# Patient Record
Sex: Female | Born: 1935 | Race: White | Hispanic: No | Marital: Married | Smoking: Never smoker
Health system: Southern US, Community
[De-identification: ages and names within clinical notes are randomized; demographics above are authoritative.]

## PROBLEM LIST (undated history)

## (undated) DIAGNOSIS — I10 Essential (primary) hypertension: Secondary | ICD-10-CM

## (undated) DIAGNOSIS — M109 Gout, unspecified: Secondary | ICD-10-CM

## (undated) DIAGNOSIS — N390 Urinary tract infection, site not specified: Secondary | ICD-10-CM

## (undated) DIAGNOSIS — H9190 Unspecified hearing loss, unspecified ear: Secondary | ICD-10-CM

## (undated) DIAGNOSIS — E785 Hyperlipidemia, unspecified: Secondary | ICD-10-CM

## (undated) HISTORY — PX: KNEE ARTHROSCOPY: SHX127

---

## 2005-09-22 ENCOUNTER — Emergency Department: Payer: Self-pay | Admitting: General Practice

## 2005-10-09 ENCOUNTER — Ambulatory Visit: Payer: Self-pay | Admitting: Internal Medicine

## 2005-10-14 ENCOUNTER — Ambulatory Visit: Payer: Self-pay | Admitting: Pain Medicine

## 2005-11-04 ENCOUNTER — Ambulatory Visit: Payer: Self-pay | Admitting: Pain Medicine

## 2005-11-18 ENCOUNTER — Ambulatory Visit: Payer: Self-pay | Admitting: Pain Medicine

## 2005-12-03 ENCOUNTER — Ambulatory Visit: Payer: Self-pay | Admitting: Physician Assistant

## 2006-07-20 ENCOUNTER — Ambulatory Visit: Payer: Self-pay | Admitting: Unknown Physician Specialty

## 2006-07-29 ENCOUNTER — Other Ambulatory Visit: Payer: Self-pay

## 2006-07-29 ENCOUNTER — Ambulatory Visit: Payer: Self-pay | Admitting: Unknown Physician Specialty

## 2012-09-13 ENCOUNTER — Ambulatory Visit: Payer: Self-pay | Admitting: Family Medicine

## 2013-02-23 ENCOUNTER — Ambulatory Visit: Payer: Self-pay | Admitting: Ophthalmology

## 2013-02-23 DIAGNOSIS — I10 Essential (primary) hypertension: Secondary | ICD-10-CM

## 2013-03-09 ENCOUNTER — Ambulatory Visit: Payer: Self-pay | Admitting: Ophthalmology

## 2013-09-01 ENCOUNTER — Ambulatory Visit: Payer: Self-pay | Admitting: Ophthalmology

## 2013-09-12 ENCOUNTER — Ambulatory Visit: Payer: Self-pay | Admitting: Ophthalmology

## 2013-09-14 ENCOUNTER — Ambulatory Visit: Payer: Self-pay | Admitting: Family Medicine

## 2014-09-18 ENCOUNTER — Ambulatory Visit: Payer: Self-pay | Admitting: Family Medicine

## 2015-01-12 NOTE — Op Note (Signed)
PATIENT NAME:  Martha CollumFOSTER, Bellina MR#:  [id number] DATE OF BIRTH:  09-Jan-1924  DATE OF PROCEDURE:  03/09/2013  PREOPERATIVE DIAGNOSIS: Visually significant cataract of the left eye.   POSTOPERATIVE DIAGNOSIS: Visually significant cataract of the left eye.   OPERATIVE PROCEDURE: Cataract extraction by phacoemulsification with implant of intraocular lens to the left eye.   SURGEON: Galen ManilaWilliam Maxamus Colao, MD.   ANESTHESIA:  1. Managed anesthesia care.  2. Topical tetracaine drops followed by 2% Xylocaine jelly applied in the preoperative holding area.   COMPLICATIONS: None.   TECHNIQUE:  Stop-and-chop.   DESCRIPTION OF PROCEDURE: The patient was examined and consented in the preoperative holding area where the aforementioned topical anesthesia was applied to the left eye and then brought back to the operating room where the left eye was prepped and draped in the usual sterile ophthalmic fashion and a lid speculum was placed. A paracentesis was created with the side port blade and the anterior chamber was filled with viscoelastic. A near clear corneal incision was performed with the steel keratome. A continuous curvilinear capsulorrhexis was performed with a cystotome followed by the capsulorrhexis forceps. Hydrodissection and hydrodelineation were carried out with BSS on a blunt cannula. The lens was removed in a stop-and-chop technique and the remaining cortical material was removed with the irrigation-aspiration handpiece. The capsular bag was inflated with viscoelastic and the Tecnis ZCBOO 18.0-diopter lens, serial #[id number] was placed in the capsular bag without complication. The remaining viscoelastic was removed from the eye with the irrigation-aspiration handpiece. The wounds were hydrated. The anterior chamber was flushed with Miostat and the eye was inflated to physiologic pressure; 0.1 mL of cefuroxime at a concentration of 10 mg/mL was placed in the anterior chamber. The wounds were found to be  water tight. The eye was dressed with Vigamox. The patient was given protective glasses to wear throughout the day and a shield with which to sleep tonight. The patient was also given drops with which to begin a drop regimen today and will follow-up with me in 1 day.     ____________________________ Jerilee FieldWilliam L. Darriel Utter, MD wlp:dm D: 03/09/2013 12:24:00 ET T: 03/09/2013 15:29:52 ET JOB#: [id number]  cc: Brianni Manthe L. Shadow Stiggers, MD, <Dictator> Jerilee FieldWILLIAM L Donnis Pecha MD ELECTRONICALLY SIGNED 03/10/2013 14:40

## 2015-01-13 NOTE — Op Note (Signed)
PATIENT NAME:  Martha CollumFOSTER, Emmerie MR#:  [id number] DATE OF BIRTH:  [birth year]  DATE OF PROCEDURE:  09/12/2013  PREOPERATIVE DIAGNOSIS: Visually significant cataract of the right eye.   POSTOPERATIVE DIAGNOSIS: Visually significant cataract of the right eye.   OPERATIVE PROCEDURE: Cataract extraction by phacoemulsification with implant of intraocular lens to the right eye.   SURGEON: Galen ManilaWilliam Scorpio Fortin, MD.   ANESTHESIA:  1. Managed anesthesia care.  2. Topical tetracaine drops followed by 2% Xylocaine jelly applied in the preoperative holding area.   COMPLICATIONS: None.   TECHNIQUE:  Stop and chop.    DESCRIPTION OF PROCEDURE: The patient was examined and consented in the preoperative holding area where the aforementioned topical anesthesia was applied to the right eye and then brought back to the Operating Room where the right eye was prepped and draped in the usual sterile ophthalmic fashion and a lid speculum was placed. A paracentesis was created with the side port blade and the anterior chamber was filled with viscoelastic. A near clear corneal incision was performed with the steel keratome. A continuous curvilinear capsulorrhexis was performed with a cystotome followed by the capsulorrhexis forceps. Hydrodissection and hydrodelineation were carried out with BSS on a blunt cannula. The lens was removed in a stop and chop technique and the remaining cortical material was removed with the irrigation-aspiration handpiece. The capsular bag was inflated with viscoelastic and the Tecnis ZCB00 19.0-diopter lens, serial number [id number] was placed in the capsular bag without complication. The remaining viscoelastic was removed from the eye with the irrigation-aspiration handpiece. The wounds were hydrated. The anterior chamber was flushed with Miostat and the eye was inflated to physiologic pressure. 0.1 mL of cefuroxime concentration 10 mg/mL was placed in the anterior chamber. The wounds were found to be  water tight. The eye was dressed with Vigamox. The patient was given protective glasses to wear throughout the day and a shield with which to sleep tonight. The patient was also given drops with which to begin a drop regimen today and will follow-up with me in one day.   ____________________________ Jerilee FieldWilliam L. Shanta Dorvil, MD wlp:gb D: 09/12/2013 17:00:16 ET T: 09/12/2013 23:05:25 ET JOB#: [id number]  cc: Perina Salvaggio L. Zariana Strub, MD, <Dictator> Jerilee FieldWILLIAM L Tyrina Hines MD ELECTRONICALLY SIGNED 09/29/2013 17:18

## 2016-07-29 DIAGNOSIS — R001 Bradycardia, unspecified: Secondary | ICD-10-CM | POA: Insufficient documentation

## 2020-02-29 ENCOUNTER — Encounter: Payer: Self-pay | Admitting: Urology

## 2020-02-29 ENCOUNTER — Ambulatory Visit: Payer: Medicare Other | Admitting: Urology

## 2020-02-29 ENCOUNTER — Other Ambulatory Visit: Payer: Self-pay

## 2020-02-29 ENCOUNTER — Encounter (INDEPENDENT_AMBULATORY_CARE_PROVIDER_SITE_OTHER): Payer: Self-pay

## 2020-02-29 VITALS — BP 153/73 | HR 61 | Ht 61.0 in | Wt 141.0 lb

## 2020-02-29 DIAGNOSIS — R351 Nocturia: Secondary | ICD-10-CM | POA: Diagnosis not present

## 2020-02-29 DIAGNOSIS — R3129 Other microscopic hematuria: Secondary | ICD-10-CM | POA: Diagnosis not present

## 2020-02-29 LAB — BLADDER SCAN AMB NON-IMAGING

## 2020-02-29 MED ORDER — CEPHALEXIN 500 MG PO CAPS: 500.0000 mg | ORAL_CAPSULE | Freq: Every day | ORAL | 11 refills | Status: DC

## 2020-02-29 NOTE — Patient Instructions (Signed)
1. Stop drinking fluids 3-4 hours before going to bed 2. Urinate right before going to bed  Follow up in one month for symptom check

## 2020-02-29 NOTE — Progress Notes (Signed)
   02/29/20 9:33 AM   Lidia Collum 1924-05-14 [id number]  CC: Nocturia  HPI: I saw Ms. Bobier in urology clinic today for nocturia.  She is a 86 year old frail-appearing female who reports a multiple year history of nocturia every hour overnight.  She has no urinary complaints during the day.  She does have some occasional leakage overnight as well.  She also has a history of recurrent urinary tract infections, please see care everywhere for full details.  She denies any gross hematuria.  She previously was followed by Dr. Orson Slick in urology and reportedly underwent a urethral dilation at some point, these notes are unavailable to me though.  Myrbetriq is listed on her medication list, but she denies that she takes this medication.  She goes to bed at 6 PM and wakes up at 9 AM.  She drinks only water during the day, but continues to drink fluids up to right before bed.  She takes melatonin before bed to help with sleep.  Urinalysis today 11-30 WBCs, 3-10 RBCs, no bacteria, nitrite negative.  PVR normal at 11 mL.   Social History:  reports that she has never smoked. She has never used smokeless tobacco. She reports that she does not drink alcohol or use drugs.  Physical Exam: BP (!) 153/73   Pulse 61   Ht 5\' 1"  (1.549 m)   Wt 141 lb (64 kg)   BMI 26.64 kg/m    Constitutional: Elderly, frail appearing, in wheelchair Cardiovascular: No clubbing, cyanosis, or edema. Respiratory: Normal respiratory effort, no increased work of breathing. No lower extremity edema  Laboratory Data: Reviewed, see HPI  Pertinent Imaging: None to review  Assessment & Plan:   In summary, she is a 86 year old very frail and elderly female with multiple year history of nocturia every hour overnight.  I had a very long conversation with the patient and her son about realistic expectations regarding her urinary symptoms and nocturia.  I recommend starting with behavioral strategies including minimizing fluids 3 to  4 hours before bedtime, and double voiding prior to bed.  I also recommended a renal ultrasound to rule out any significant pathology in the setting of her recurrent UTIs and microscopic hematuria today.  I also recommended a trial of Myrbetriq 25 mg daily, she has not been taking this medication reportedly despite being on her med list.  Behavioral strategies discussed at length Trial of Myrbetriq 25 mg daily RTC 1 month for symptom check and discuss renal ultrasound results   97, MD 02/29/2020  [facility] [address] [place], [place] [phone]

## 2020-03-01 LAB — URINALYSIS, COMPLETE
Bilirubin, UA: NEGATIVE
Glucose, UA: NEGATIVE
Ketones, UA: NEGATIVE
Nitrite, UA: NEGATIVE
Protein,UA: NEGATIVE
Specific Gravity, UA: 1.01 (ref 1.005–1.030)
Urobilinogen, Ur: 0.2 mg/dL (ref 0.2–1.0)
pH, UA: 5 (ref 5.0–7.5)

## 2020-03-01 LAB — MICROSCOPIC EXAMINATION: Bacteria, UA: NONE SEEN

## 2020-03-02 LAB — CULTURE, URINE COMPREHENSIVE

## 2020-03-05 ENCOUNTER — Telehealth: Payer: Self-pay

## 2020-03-05 MED ORDER — AMOXICILLIN 500 MG PO CAPS: 500.0000 mg | ORAL_CAPSULE | Freq: Two times a day (BID) | ORAL | 0 refills | Status: DC

## 2020-03-05 NOTE — Telephone Encounter (Signed)
Patient notified and script sent to pharmacy. Patient states she is taking suppression abx 1 daily, she was instructed to stop while taking the Amoxicillin and restart after she completes that course. Patient verbalized understanding

## 2020-03-05 NOTE — Telephone Encounter (Signed)
-----   Message from Sondra Come, MD sent at 03/05/2020 11:46 AM EDT ----- She had some bacteria in the urine, possible uti, Lets do amoxicillin 500mg  bID x 3 days, thanks  , MD 03/05/2020

## 2020-04-02 ENCOUNTER — Ambulatory Visit
Admission: RE | Admit: 2020-04-02 | Discharge: 2020-04-02 | Disposition: A | Discharge status: Home / Self Care | Payer: Medicare Other | Source: Ambulatory Visit | Attending: Urology | Admitting: Urology

## 2020-04-02 ENCOUNTER — Other Ambulatory Visit: Payer: Self-pay

## 2020-04-02 DIAGNOSIS — R3129 Other microscopic hematuria: Secondary | ICD-10-CM | POA: Insufficient documentation

## 2020-04-02 DIAGNOSIS — R351 Nocturia: Secondary | ICD-10-CM

## 2020-04-04 ENCOUNTER — Ambulatory Visit (INDEPENDENT_AMBULATORY_CARE_PROVIDER_SITE_OTHER): Payer: Medicare Other | Admitting: Urology

## 2020-04-04 ENCOUNTER — Other Ambulatory Visit: Payer: Self-pay

## 2020-04-04 ENCOUNTER — Encounter: Payer: Self-pay | Admitting: Urology

## 2020-04-04 VITALS — BP 140/77 | HR 65 | Ht 65.0 in | Wt 140.0 lb

## 2020-04-04 DIAGNOSIS — N3281 Overactive bladder: Secondary | ICD-10-CM | POA: Diagnosis not present

## 2020-04-04 DIAGNOSIS — R351 Nocturia: Secondary | ICD-10-CM

## 2020-04-04 DIAGNOSIS — N63 Unspecified lump in unspecified breast: Secondary | ICD-10-CM | POA: Insufficient documentation

## 2020-04-04 DIAGNOSIS — M109 Gout, unspecified: Secondary | ICD-10-CM | POA: Insufficient documentation

## 2020-04-04 DIAGNOSIS — B029 Zoster without complications: Secondary | ICD-10-CM | POA: Insufficient documentation

## 2020-04-04 DIAGNOSIS — N390 Urinary tract infection, site not specified: Secondary | ICD-10-CM | POA: Diagnosis not present

## 2020-04-04 DIAGNOSIS — I10 Essential (primary) hypertension: Secondary | ICD-10-CM | POA: Insufficient documentation

## 2020-04-04 MED ORDER — MIRABEGRON ER 50 MG PO TB24: 50.0000 mg | ORAL_TABLET | Freq: Every day | ORAL | 11 refills | Status: DC

## 2020-04-04 MED ORDER — MIRABEGRON ER 25 MG PO TB24: 50.0000 mg | ORAL_TABLET | Freq: Every day | ORAL | 11 refills | Status: DC

## 2020-04-04 NOTE — Progress Notes (Signed)
   04/04/2020 9:26 AM   Martha Vazquez [birth year] [id number]  Reason for visit: Follow up OAB, nocturia, rUTI  HPI: I saw Martha Vazquez and her son back in urology clinic today to discuss the above issues.  Briefly, she is a 86 year old frail-appearing female who has a long history of nocturia every hour overnight, and minimal urinary complaints during the day.  She was getting up every hour when I met her originally.  PVR was normal at 11 mL.  We tried Myrbetriq 25 mg daily, and we discussed behavioral strategies at length previously including minimizing fluids before bed, and voiding prior to bed.  We also performed a renal ultrasound with her history of recurrent UTIs and microscopic hematuria, which was benign.  She continues to have bothersome nocturia, but is now getting up every hour and a half instead of every hour.  Urine culture at her last visit ultimately grew 25-50k Enterococcus, and she was treated with culture appropriate amoxicillin.  She does not think this improved her urinary symptoms significantly.  We had a long conversation about the difference between asymptomatic bacteriuria versus true UTI.  With her lack of symptoms during the day, no dysuria, and only overnight symptoms, I do not think this represented a true UTI, and I think her symptoms are related to age and the fact that she is sleeping at least 15 hours a night from 6 PM to 9 AM.  I again was very frank with the patient and her son regarding realistic expectations in the setting of her age and frailty.  Anticholinergics would be strictly contraindicated with her age and frailty and risk of confusion or falls, and I would also avoid desmopressin with her risk for hyponatremia or even seizures.  Increase Myrbetriq to 50 mg daily, take in the afternoon instead of morning Continue behavioral strategies and minimizing fluids within a few hours of bedtime RTC 6 months for follow-up   [place], [place] [address], [place], [place] [address] [phone]

## 2020-04-04 NOTE — Patient Instructions (Signed)
Take Mybetriq before bed.

## 2020-10-04 ENCOUNTER — Ambulatory Visit: Payer: Self-pay | Admitting: Urology

## 2020-12-27 ENCOUNTER — Ambulatory Visit (INDEPENDENT_AMBULATORY_CARE_PROVIDER_SITE_OTHER): Payer: Medicare Other | Admitting: Urology

## 2020-12-27 ENCOUNTER — Other Ambulatory Visit: Payer: Self-pay

## 2020-12-27 ENCOUNTER — Encounter: Payer: Self-pay | Admitting: Urology

## 2020-12-27 VITALS — BP 92/54 | HR 53 | Ht 65.0 in | Wt 157.0 lb

## 2020-12-27 DIAGNOSIS — N3281 Overactive bladder: Secondary | ICD-10-CM | POA: Diagnosis not present

## 2020-12-27 DIAGNOSIS — N390 Urinary tract infection, site not specified: Secondary | ICD-10-CM

## 2020-12-27 NOTE — Progress Notes (Signed)
   12/27/2020 10:49 AM   Martha Vazquez [birth year] [id number]  Reason for visit: History of UTIs, OAB/nocturia  HPI: I saw [name] and her family today for possible UTI.  She is a very frail 87 year old female who had a recent fall and was having some back pain and was recently diagnosed by PCP with a UTI yesterday.  She denies any dysuria or pelvic pain.  Urinalysis yesterday was concerning for infection with nitrite positive, large leukocytes, greater than 50 WBCs, 0 RBCs, few bacteria, rare squamous cells.  This was sent for culture, and she was started on Cipro 250 mg twice daily x5 days.  Lab work also notable for hyperkalemia of 6.5, and creatinine of 1.7 from a creatinine of 1.2 at baseline.  Sounds like she could be dehydrated as well.  She appears to be at her baseline cognition today.  Previously, she was on Keflex daily prophylaxis, and denies any UTIs over the last 9 months.  She previously trialed Myrbetriq for her overactive symptoms and nocturia without any significant improvement.  Part of her nocturia is that she goes to bed at 6 PM and wakes up at 9 AM, and so understandably she does get up to void during that time.  Agree with likely UTI, we again discussed the differences between asymptomatic bacteriuria and true UTI, but with her back pain and recent fall, I think is very reasonable to treat this with antibiotics.  It sounds like her PCP repeated labs today to confirm improvement in her hyperkalemia.  Agree with Cipro, follow-up cultures, call with results Consider ongoing prophylactic antibiotics pending culture findings    Sondra Come, MD  [facility] [address], [address] [place], [place] [address] [phone]

## 2021-03-13 ENCOUNTER — Other Ambulatory Visit: Payer: Self-pay | Admitting: Nephrology

## 2021-03-13 DIAGNOSIS — N179 Acute kidney failure, unspecified: Secondary | ICD-10-CM

## 2021-03-13 DIAGNOSIS — N1832 Chronic kidney disease, stage 3b: Secondary | ICD-10-CM

## 2021-07-03 ENCOUNTER — Ambulatory Visit: Payer: Self-pay | Admitting: Urology

## 2021-11-28 ENCOUNTER — Inpatient Hospital Stay
Admission: EM | Admit: 2021-11-28 | Discharge: 2021-11-29 | DRG: 871 | Disposition: A | Discharge status: Hospice | Payer: Medicare Other | Attending: Internal Medicine | Admitting: Internal Medicine

## 2021-11-28 ENCOUNTER — Other Ambulatory Visit: Payer: Self-pay

## 2021-11-28 ENCOUNTER — Emergency Department: Payer: Medicare Other

## 2021-11-28 DIAGNOSIS — R296 Repeated falls: Secondary | ICD-10-CM | POA: Diagnosis present

## 2021-11-28 DIAGNOSIS — Z66 Do not resuscitate: Secondary | ICD-10-CM | POA: Diagnosis present

## 2021-11-28 DIAGNOSIS — E875 Hyperkalemia: Secondary | ICD-10-CM | POA: Diagnosis present

## 2021-11-28 DIAGNOSIS — Z79899 Other long term (current) drug therapy: Secondary | ICD-10-CM | POA: Diagnosis not present

## 2021-11-28 DIAGNOSIS — I10 Essential (primary) hypertension: Secondary | ICD-10-CM | POA: Diagnosis not present

## 2021-11-28 DIAGNOSIS — N390 Urinary tract infection, site not specified: Secondary | ICD-10-CM | POA: Insufficient documentation

## 2021-11-28 DIAGNOSIS — G9341 Metabolic encephalopathy: Secondary | ICD-10-CM | POA: Diagnosis present

## 2021-11-28 DIAGNOSIS — N3001 Acute cystitis with hematuria: Secondary | ICD-10-CM | POA: Diagnosis not present

## 2021-11-28 DIAGNOSIS — N1831 Chronic kidney disease, stage 3a: Secondary | ICD-10-CM | POA: Diagnosis present

## 2021-11-28 DIAGNOSIS — Z20822 Contact with and (suspected) exposure to covid-19: Secondary | ICD-10-CM | POA: Diagnosis present

## 2021-11-28 DIAGNOSIS — R001 Bradycardia, unspecified: Secondary | ICD-10-CM

## 2021-11-28 DIAGNOSIS — W19XXXA Unspecified fall, initial encounter: Principal | ICD-10-CM

## 2021-11-28 DIAGNOSIS — R680 Hypothermia, not associated with low environmental temperature: Secondary | ICD-10-CM | POA: Diagnosis present

## 2021-11-28 DIAGNOSIS — M19012 Primary osteoarthritis, left shoulder: Secondary | ICD-10-CM | POA: Diagnosis present

## 2021-11-28 DIAGNOSIS — S40012A Contusion of left shoulder, initial encounter: Secondary | ICD-10-CM

## 2021-11-28 DIAGNOSIS — A419 Sepsis, unspecified organism: Secondary | ICD-10-CM | POA: Diagnosis present

## 2021-11-28 DIAGNOSIS — T68XXXA Hypothermia, initial encounter: Secondary | ICD-10-CM

## 2021-11-28 DIAGNOSIS — R6520 Severe sepsis without septic shock: Secondary | ICD-10-CM | POA: Diagnosis present

## 2021-11-28 DIAGNOSIS — I129 Hypertensive chronic kidney disease with stage 1 through stage 4 chronic kidney disease, or unspecified chronic kidney disease: Secondary | ICD-10-CM | POA: Diagnosis present

## 2021-11-28 DIAGNOSIS — H9190 Unspecified hearing loss, unspecified ear: Secondary | ICD-10-CM | POA: Diagnosis present

## 2021-11-28 DIAGNOSIS — N3000 Acute cystitis without hematuria: Secondary | ICD-10-CM | POA: Diagnosis not present

## 2021-11-28 DIAGNOSIS — Z7189 Other specified counseling: Secondary | ICD-10-CM

## 2021-11-28 DIAGNOSIS — Z87440 Personal history of urinary (tract) infections: Secondary | ICD-10-CM

## 2021-11-28 DIAGNOSIS — M109 Gout, unspecified: Secondary | ICD-10-CM | POA: Diagnosis present

## 2021-11-28 DIAGNOSIS — E785 Hyperlipidemia, unspecified: Secondary | ICD-10-CM | POA: Diagnosis present

## 2021-11-28 HISTORY — DX: Unspecified hearing loss, unspecified ear: H91.90

## 2021-11-28 HISTORY — DX: Urinary tract infection, site not specified: N39.0

## 2021-11-28 HISTORY — DX: Essential (primary) hypertension: I10

## 2021-11-28 HISTORY — DX: Hyperlipidemia, unspecified: E78.5

## 2021-11-28 HISTORY — DX: Gout, unspecified: M10.9

## 2021-11-28 LAB — PROTIME-INR
INR: 0.9 (ref 0.8–1.2)
Prothrombin Time: 12.6 seconds (ref 11.4–15.2)

## 2021-11-28 LAB — URINALYSIS, ROUTINE W REFLEX MICROSCOPIC
Bilirubin Urine: NEGATIVE
Glucose, UA: NEGATIVE mg/dL
Hgb urine dipstick: NEGATIVE
Ketones, ur: NEGATIVE mg/dL
Nitrite: NEGATIVE
Protein, ur: NEGATIVE mg/dL
Specific Gravity, Urine: 1.011 (ref 1.005–1.030)
pH: 7 (ref 5.0–8.0)

## 2021-11-28 LAB — CBC WITH DIFFERENTIAL/PLATELET
Abs Immature Granulocytes: 0.06 10*3/uL (ref 0.00–0.07)
Basophils Absolute: 0.1 10*3/uL (ref 0.0–0.1)
Basophils Relative: 1 %
Eosinophils Absolute: 0.2 10*3/uL (ref 0.0–0.5)
Eosinophils Relative: 2 %
HCT: 45 % (ref 36.0–46.0)
Hemoglobin: 14.4 g/dL (ref 12.0–15.0)
Immature Granulocytes: 1 %
Lymphocytes Relative: 23 %
Lymphs Abs: 2.4 10*3/uL (ref 0.7–4.0)
MCH: 29.2 pg (ref 26.0–34.0)
MCHC: 32 g/dL (ref 30.0–36.0)
MCV: 91.3 fL (ref 80.0–100.0)
Monocytes Absolute: 0.7 10*3/uL (ref 0.1–1.0)
Monocytes Relative: 7 %
Neutro Abs: 7.1 10*3/uL (ref 1.7–7.7)
Neutrophils Relative %: 66 %
Platelets: 276 10*3/uL (ref 150–400)
RBC: 4.93 MIL/uL (ref 3.87–5.11)
RDW: 15.5 % (ref 11.5–15.5)
WBC: 10.6 10*3/uL — ABNORMAL HIGH (ref 4.0–10.5)
nRBC: 0 % (ref 0.0–0.2)

## 2021-11-28 LAB — RESP PANEL BY RT-PCR (FLU A&B, COVID) ARPGX2
Influenza A by PCR: NEGATIVE
Influenza B by PCR: NEGATIVE
SARS Coronavirus 2 by RT PCR: NEGATIVE

## 2021-11-28 LAB — CK: Total CK: 80 U/L (ref 38–234)

## 2021-11-28 LAB — COMPREHENSIVE METABOLIC PANEL
ALT: 25 U/L (ref 0–44)
AST: 28 U/L (ref 15–41)
Albumin: 3.9 g/dL (ref 3.5–5.0)
Alkaline Phosphatase: 99 U/L (ref 38–126)
Anion gap: 8 (ref 5–15)
BUN: 31 mg/dL — ABNORMAL HIGH (ref 8–23)
CO2: 21 mmol/L — ABNORMAL LOW (ref 22–32)
Calcium: 9.9 mg/dL (ref 8.9–10.3)
Chloride: 110 mmol/L (ref 98–111)
Creatinine, Ser: 1.05 mg/dL — ABNORMAL HIGH (ref 0.44–1.00)
GFR, Estimated: 49 mL/min — ABNORMAL LOW (ref >60)
Glucose, Bld: 92 mg/dL (ref 70–99)
Potassium: 5.8 mmol/L — ABNORMAL HIGH (ref 3.5–5.1)
Sodium: 139 mmol/L (ref 135–145)
Total Bilirubin: 0.5 mg/dL (ref 0.3–1.2)
Total Protein: 7.4 g/dL (ref 6.5–8.1)

## 2021-11-28 LAB — TSH: TSH: 4.681 u[IU]/mL — ABNORMAL HIGH (ref 0.350–4.500)

## 2021-11-28 LAB — TROPONIN I (HIGH SENSITIVITY): Troponin I (High Sensitivity): 11 ng/L (ref <18)

## 2021-11-28 LAB — LACTIC ACID, PLASMA: Lactic Acid, Venous: 0.7 mmol/L (ref 0.5–1.9)

## 2021-11-28 MED ORDER — ALLOPURINOL 100 MG PO TABS
100.0000 mg | ORAL_TABLET | Freq: Every day | ORAL | Status: DC
Start: 2021-11-28 — End: 2021-11-30
  Administered 2021-11-29: 100 mg via ORAL
  Filled 2021-11-28 (×3): qty 1

## 2021-11-28 MED ORDER — ACETAMINOPHEN 325 MG PO TABS
650.0000 mg | ORAL_TABLET | Freq: Four times a day (QID) | ORAL | Status: DC | PRN
  Administered 2021-11-29: 650 mg via ORAL
  Filled 2021-11-28: qty 2

## 2021-11-28 MED ORDER — SODIUM CHLORIDE 0.9 % IV SOLN
1.0000 g | INTRAVENOUS | Status: DC
  Administered 2021-11-29: 1 g via INTRAVENOUS
  Filled 2021-11-28 (×2): qty 10

## 2021-11-28 MED ORDER — SODIUM CHLORIDE 0.9 % IV SOLN
INTRAVENOUS | Status: AC
Start: 2021-11-28 — End: 2021-11-29

## 2021-11-28 MED ORDER — LACTATED RINGERS IV BOLUS
1000.0000 mL | Freq: Once | INTRAVENOUS | Status: AC
Start: 2021-11-28 — End: 2021-11-28
  Administered 2021-11-28: 11:00:00 1000 mL via INTRAVENOUS

## 2021-11-28 MED ORDER — HYDRALAZINE HCL 10 MG PO TABS
10.0000 mg | ORAL_TABLET | Freq: Three times a day (TID) | ORAL | Status: DC
  Administered 2021-11-29: 10 mg via ORAL
  Filled 2021-11-28 (×7): qty 1

## 2021-11-28 MED ORDER — SODIUM CHLORIDE 0.9 % IV SOLN
1.0000 g | Freq: Once | INTRAVENOUS | Status: AC
  Administered 2021-11-28: 14:00:00 1 g via INTRAVENOUS

## 2021-11-28 MED ORDER — SODIUM ZIRCONIUM CYCLOSILICATE 10 G PO PACK
10.0000 g | PACK | Freq: Once | ORAL | Status: DC
  Filled 2021-11-28: qty 1

## 2021-11-28 MED ORDER — HEPARIN SODIUM (PORCINE) 5000 UNIT/ML IJ SOLN
5000.0000 [IU] | Freq: Two times a day (BID) | INTRAMUSCULAR | Status: DC
  Administered 2021-11-29: 5000 [IU] via SUBCUTANEOUS
  Filled 2021-11-28: qty 1

## 2021-11-28 MED ORDER — PRAVASTATIN SODIUM 20 MG PO TABS
40.0000 mg | ORAL_TABLET | Freq: Every day | ORAL | Status: DC
  Administered 2021-11-29: 40 mg via ORAL
  Filled 2021-11-28: qty 2

## 2021-11-28 MED ORDER — ONDANSETRON HCL 4 MG PO TABS: 4.0000 mg | ORAL_TABLET | Freq: Four times a day (QID) | ORAL | Status: DC | PRN

## 2021-11-28 MED ORDER — ACETAMINOPHEN 650 MG RE SUPP: 650.0000 mg | Freq: Four times a day (QID) | RECTAL | Status: DC | PRN

## 2021-11-28 MED ORDER — ONDANSETRON HCL 4 MG/2ML IJ SOLN: 4.0000 mg | Freq: Four times a day (QID) | INTRAMUSCULAR | Status: DC | PRN

## 2021-11-28 MED ORDER — LACTATED RINGERS IV BOLUS
1000.0000 mL | Freq: Once | INTRAVENOUS | Status: AC
  Administered 2021-11-28: 14:00:00 1000 mL via INTRAVENOUS

## 2021-11-28 MED ORDER — AMLODIPINE BESYLATE 10 MG PO TABS
10.0000 mg | ORAL_TABLET | Freq: Every day | ORAL | Status: DC
Start: 2021-11-28 — End: 2021-11-30
  Filled 2021-11-28: qty 2

## 2021-11-28 MED ORDER — BISACODYL 5 MG PO TBEC: 5.0000 mg | DELAYED_RELEASE_TABLET | Freq: Every day | ORAL | Status: DC | PRN

## 2021-11-28 NOTE — ED Notes (Signed)
Pt and son requesting to speak to MD. Pt requesting to be made comfort care.  ?

## 2021-11-28 NOTE — ED Notes (Signed)
Attempted to call report. Waiting on ICU charge to arrive back on floor and accept the pt ? ?

## 2021-11-28 NOTE — ED Triage Notes (Signed)
Pt is here with her son, pt lives alone, states she lost her balance and fell yesterday around 5pm and is c/o left shoulder pain, did hit her head, is not on blood thinner, pt is alert on arrival, is very HOH..uses a walker to ambulate ?

## 2021-11-28 NOTE — ED Notes (Signed)
Patient alert, extremely hard of hearing and unable to communicate with staff as family members took her hearing aides home. Able to state name and ask appropriate questions but unable to understand RN. Resp even, unlabored on RA. No distress noted at this time. ?

## 2021-11-28 NOTE — H&P (Signed)
History and Physical    Martha Vazquez [id number] DOB: 04-27-1924 DOA: 11/28/2021  PCP: Maryland Pink, MD (Confirm with patient/family/NH records and if not entered, this has to be entered at [facility] point of entry) Patient coming from: Home  I have personally briefly reviewed patient's old medical records in [place]  Chief Complaint: I fell  HPI: Martha Vazquez is a 88 y.o. female with medical history significant of recurrent UTIs, sinus bradycardia, HTN, HLD, gout, multiple OA, frequent falls, came with worsening of left shoulder pain and frequent falls.  Patient lives by herself with home care at daytime, son and daughter stop by often.  For the last 2 to 3 days, patient has had worsening of left shoulder pain which is chronic and unsteady gait and frequent falls.  Home care personnel also reported patient has had frequent urination since yesterday.  Patient used to be on suppressive antibiotics for recurrent UTIs which was discontinued 6 months ago, since then patient has had [place]y thought of UTI which was treated with p.o. antibiotics.  Currently, patient complaining about feeling cold and left shoulder pain.  Denies any dysuria no abdominal pain.  This morning, family went to see the patient and found patient was confused and suspect patient has recurrent UTI, and sent her to ED.  At baseline patient has had ambulation dysfunction and frequent falls, 2 years ago patient was diagnosed with bradycardia and was referred to have pacemaker evaluation, however at that point patient has made up her mind that she wanted to be DNR and does not want any aggressive measures including pacemaker.  ED Course: Bradycardia heart rate varies from 37-43, SBP 150s.  Temperature 92.4, O2 saturation 97% on room air.  Compatible with UTI, WBC 10.6, K5.8, creatinine 1.0.  Lactic acid 0.7.  Patient was started on ceftriaxone and IV fluids.  Left shoulder x-ray negative for acute findings but worsening of  OA.  Review of Systems: As per HPI otherwise 14 point review of systems negative.    Past Medical History:  Diagnosis Date   Gout    HOH (hard of hearing)    Hyperlipidemia    Hypertension    UTI (urinary tract infection)     Past Surgical History:  Procedure Laterality Date   KNEE ARTHROSCOPY       reports that she has never smoked. She has never used smokeless tobacco. She reports that she does not drink alcohol and does not use drugs.  Allergies  Allergen Reactions   Prednisone Swelling   Sulfamethoxazole-Trimethoprim Swelling    No family history on file.   Prior to Admission medications   Medication Sig Start Date End Date Taking? Authorizing Provider  allopurinol (ZYLOPRIM) 100 MG tablet Take 100 mg by mouth daily. 11/18/19  Yes [provider]  amLODipine (NORVASC) 10 MG tablet Take 10 mg by mouth daily. 11/18/19  Yes [provider]  lisinopril (ZESTRIL) 20 MG tablet Take 20 mg by mouth daily. 11/18/19  Yes [provider]  pravastatin (PRAVACHOL) 40 MG tablet Take 40 mg by mouth daily. 11/18/19  Yes [provider]  meloxicam (MOBIC) 7.5 MG tablet Take 7.5 mg by mouth daily. Patient not taking: Reported on 11/28/2021    [provider]    Physical Exam: Vitals:   11/28/21 0936 11/28/21 1130 11/28/21 1158 11/28/21 1200  BP:  (!) 155/57  (!) 140/55  Pulse:  (!) 41  (!) 42  Resp:  11  12  Temp: (!) 93.2 F (  34 C)  (!) 92.4 F (33.6 C)   TempSrc:   Rectal   SpO2:  97%  97%  Weight: 70.3 kg       Constitutional: NAD, calm, comfortable Vitals:   11/28/21 0936 11/28/21 1130 11/28/21 1158 11/28/21 1200  BP:  (!) 155/57  (!) 140/55  Pulse:  (!) 41  (!) 42  Resp:  11  12  Temp: (!) 93.2 F (34 C)  (!) 92.4 F (33.6 C)   TempSrc:   Rectal   SpO2:  97%  97%  Weight: 70.3 kg      Eyes: PERRL, lids and conjunctivae normal ENMT: Mucous membranes are dry. Posterior pharynx clear of any exudate or lesions.Normal  dentition.  Neck: normal, supple, no masses, no thyromegaly Respiratory: clear to auscultation bilaterally, no wheezing, no crackles. Normal respiratory effort. No accessory muscle use.  Cardiovascular: Regular rate and rhythm, no murmurs / rubs / gallops. No extremity edema. 2+ pedal pulses. No carotid bruits.  Abdomen: no tenderness, no masses palpated. No hepatosplenomegaly. Bowel sounds positive.  Musculoskeletal: no clubbing / cyanosis. No joint deformity upper and lower extremities. Good ROM, no contractures. Normal muscle tone.  Skin: no rashes, lesions, ulcers. No induration Neurologic: CN 2-12 grossly intact. Sensation intact, DTR normal. Strength 5/5 in all 4.  Psychiatric: Oriented to herself, confused about time and place    Labs on Admission: I have personally reviewed following labs and imaging studies  CBC: Recent Labs  Lab 11/28/21 0956  WBC 10.6*  NEUTROABS 7.1  HGB 14.4  HCT 45.0  MCV 91.3  PLT [id number]   Basic Metabolic Panel: Recent Labs  Lab 11/28/21 0956  NA 139  K 5.8*  CL 110  CO2 21*  GLUCOSE 92  BUN 31*  CREATININE 1.05*  CALCIUM 9.9   GFR: CrCl cannot be calculated (Unknown ideal weight.). Liver Function Tests: Recent Labs  Lab 11/28/21 0956  AST 28  ALT 25  ALKPHOS 99  BILITOT 0.5  PROT 7.4  ALBUMIN 3.9   No results for input(s): LIPASE, AMYLASE in the last 168 hours. No results for input(s): AMMONIA in the last 168 hours. Coagulation Profile: Recent Labs  Lab 11/28/21 0956  INR 0.9   Cardiac Enzymes: Recent Labs  Lab 11/28/21 0956  CKTOTAL 80   BNP (last 3 results) No results for input(s): PROBNP in the last 8760 hours. HbA1C: No results for input(s): HGBA1C in the last 72 hours. CBG: No results for input(s): GLUCAP in the last 168 hours. Lipid Profile: No results for input(s): CHOL, HDL, LDLCALC, TRIG, CHOLHDL, LDLDIRECT in the last 72 hours. Thyroid Function Tests: Recent Labs    11/28/21 0956  TSH 4.681*    Anemia Panel: No results for input(s): VITAMINB12, FOLATE, FERRITIN, TIBC, IRON, RETICCTPCT in the last 72 hours. Urine analysis:    Component Value Date/Time   COLORURINE YELLOW (A) 11/28/2021 0956   APPEARANCEUR HAZY (A) 11/28/2021 0956   APPEARANCEUR Clear 02/29/2020 0842   LABSPEC 1.011 11/28/2021 0956   PHURINE 7.0 11/28/2021 0956   GLUCOSEU NEGATIVE 11/28/2021 0956   HGBUR NEGATIVE 11/28/2021 0956   BILIRUBINUR NEGATIVE 11/28/2021 0956   BILIRUBINUR Negative 02/29/2020 0842   KETONESUR NEGATIVE 11/28/2021 0956   PROTEINUR NEGATIVE 11/28/2021 0956   NITRITE NEGATIVE 11/28/2021 0956   LEUKOCYTESUR LARGE (A) 11/28/2021 0956    Radiological Exams on Admission: DG Chest 2 View  Result Date: 11/28/2021 CLINICAL DATA:  Suspected sepsis.  Fall. EXAM: CHEST - 2 VIEW COMPARISON:  Chest  radiographs 07/29/2006 FINDINGS: The cardiac silhouette is borderline enlarged. Aortic atherosclerosis is noted. Lung volumes are low with mild prominence of the interstitial markings. No overt edema, airspace consolidation, pleural effusion, or pneumothorax is identified. Asymmetrically advanced degenerative changes are noted at the left shoulder. The thoracic spine is reported separately. IMPRESSION: No active cardiopulmonary disease. Electronically Signed   By: Logan Bores M.D.   On: 11/28/2021 10:43   DG Thoracic Spine 2 View  Result Date: 11/28/2021 CLINICAL DATA:  Trauma, fall EXAM: THORACIC SPINE 2 VIEWS COMPARISON:  None. FINDINGS: No recent fracture is seen. Alignment of posterior margins of vertebral bodies is unremarkable. There is exaggeration of kyphosis. Osteopenia is seen in bony structures. IMPRESSION: No recent fracture is seen in the thoracic spine. Electronically Signed   By: Elmer Picker M.D.   On: 11/28/2021 10:48   DG Lumbar Spine Complete  Result Date: 11/28/2021 CLINICAL DATA:  Trauma, fall EXAM: LUMBAR SPINE - COMPLETE 4+ VIEW COMPARISON:  None. FINDINGS: No recent fracture  is seen. Degenerative changes are noted with disc space narrowing, bony spurs and facet hypertrophy, more severe at L4-L5 and L5-S1 levels. There is mild anterolisthesis at the L3-L4 level. Extensive arterial calcifications are seen in the aorta and its major branches. Coarse calcification in the pelvis may suggest calcified uterine fibroid. IMPRESSION: Recent fracture is seen. Severe lumbar spondylosis. There is mild anterolisthesis at L3-L4 level. Electronically Signed   By: Elmer Picker M.D.   On: 11/28/2021 10:47   CT HEAD WO CONTRAST (5MM)  Result Date: 11/28/2021 CLINICAL DATA:  [place] yesterday. EXAM: CT HEAD WITHOUT CONTRAST CT CERVICAL SPINE WITHOUT CONTRAST TECHNIQUE: Multidetector CT imaging of the head and cervical spine was performed following the standard protocol without intravenous contrast. Multiplanar CT image reconstructions of the cervical spine were also generated. RADIATION DOSE REDUCTION: This exam was performed according to the departmental dose-optimization program which includes automated exposure control, adjustment of the mA and/or kV according to patient size and/or use of iterative reconstruction technique. COMPARISON:  None. FINDINGS: CT HEAD FINDINGS Brain: No evidence of acute infarction, hemorrhage, hydrocephalus, extra-axial collection or mass lesion/mass effect. Mild generalized cerebral atrophy. Vascular: Calcified atherosclerosis at the skull base. No hyperdense vessel. Skull: Normal. Negative for fracture or focal lesion. Sinuses/Orbits: No acute finding. Other: None. CT CERVICAL SPINE FINDINGS Alignment: No traumatic malalignment. Skull base and vertebrae: No acute fracture. No primary bone lesion or focal pathologic process. Soft tissues and spinal canal: No prevertebral fluid or swelling. No visible canal hematoma. Disc levels: Mild disc height loss at C3-C4. Moderate disc height loss at C4-C5 and C5-C6. Mild diffuse facet arthropathy throughout the cervical spine.  Ankylosis of the C2-C3 facet joints. Upper chest: Negative. Other: None. IMPRESSION: 1. No acute intracranial abnormality. 2. No acute fracture or traumatic malalignment of the cervical spine. Electronically Signed   By: Titus Dubin M.D.   On: 11/28/2021 10:27   CT Cervical Spine Wo Contrast  Result Date: 11/28/2021 CLINICAL DATA:  [place] yesterday. EXAM: CT HEAD WITHOUT CONTRAST CT CERVICAL SPINE WITHOUT CONTRAST TECHNIQUE: Multidetector CT imaging of the head and cervical spine was performed following the standard protocol without intravenous contrast. Multiplanar CT image reconstructions of the cervical spine were also generated. RADIATION DOSE REDUCTION: This exam was performed according to the departmental dose-optimization program which includes automated exposure control, adjustment of the mA and/or kV according to patient size and/or use of iterative reconstruction technique. COMPARISON:  None. FINDINGS: CT HEAD FINDINGS Brain: No evidence of acute  infarction, hemorrhage, hydrocephalus, extra-axial collection or mass lesion/mass effect. Mild generalized cerebral atrophy. Vascular: Calcified atherosclerosis at the skull base. No hyperdense vessel. Skull: Normal. Negative for fracture or focal lesion. Sinuses/Orbits: No acute finding. Other: None. CT CERVICAL SPINE FINDINGS Alignment: No traumatic malalignment. Skull base and vertebrae: No acute fracture. No primary bone lesion or focal pathologic process. Soft tissues and spinal canal: No prevertebral fluid or swelling. No visible canal hematoma. Disc levels: Mild disc height loss at C3-C4. Moderate disc height loss at C4-C5 and C5-C6. Mild diffuse facet arthropathy throughout the cervical spine. Ankylosis of the C2-C3 facet joints. Upper chest: Negative. Other: None. IMPRESSION: 1. No acute intracranial abnormality. 2. No acute fracture or traumatic malalignment of the cervical spine. Electronically Signed   By: Titus Dubin M.D.   On: 11/28/2021  10:27   DG Shoulder Left  Result Date: 11/28/2021 CLINICAL DATA:  Trauma, fall EXAM: LEFT SHOULDER - 2+ VIEW COMPARISON:  None. FINDINGS: No recent fracture or dislocation is seen. Marked degenerative changes are noted with large bony spurs. There is flattening of humeral head along with sclerosis. This may be related to degenerative arthritis or avascular necrosis. There are small smooth marginated calcific densities adjacent to the greater tuberosity, possibly residual from previous injury or related to osteoarthritis. IMPRESSION: No recent fracture or dislocation is seen. Severe degenerative changes are noted in the left shoulder. There is flattening and sclerosis in the humeral head which may be related to osteoarthritis. Less likely possibility would be avascular necrosis. Electronically Signed   By: Elmer Picker M.D.   On: 11/28/2021 10:45    EKG: Independently reviewed.  Sinus bradycardia with 1st degree AV block, no acute ST-T changes.  Assessment/Plan Principal Problem:   UTI (urinary tract infection) Active Problems:   Sepsis secondary to UTI ([place])  (please populate well all problems here in Problem List. (For example, if patient is on BP meds at home and you resume or decide to hold them, it is a problem that needs to be her. Same for CAD, COPD, HLD and so on)  Sepsis secondary to UTI -Sepsis evidenced by hypothermia, new leukocytosis, and signs of endorgan damage of metabolic encephalopathy which has improved since arrival.  Source of infection is recurrent UTI.  Continue ceftriaxone while waiting for urine culture. -ED physician had a long discussion with patient family at bedside, all parties agreed that no escalation of care, 24 hours IV antibiotics and hydration treatment if no improvement family will consider comfort care.  Hyperkalemia -No significant EKG change -1 dose of Lokelma and repeat K level tonight -Discontinue lisinopril.  HTN -Continue Norvasc, discontinue  lisinopril, start hydralazine.  Chronic sinus bradycardia -No PPM as per patient's wish.  Left shoulder OA -Tylenol PRN, PT/OT evaluation. Xray reassuring  Frequent falls -PT evaluation.  DVT prophylaxis: Heparin subcu Code Status: DNR Family Communication: Son and daughter at bedside Disposition Plan: Patient is sick with sepsis bradycardia, expect more than 2 midnight hospital stay, expect discharge to home PT versus SNF. Consults called: None Admission status: Tele admit   Lequita Halt MD [facility] Pager [phone]  11/28/2021, 1:35 PM

## 2021-11-28 NOTE — ED Notes (Signed)
Pt placed on bed pan for BM. Pt had a small BM pt pads changed and brief removed.  ?

## 2021-11-28 NOTE — Progress Notes (Signed)
Elink following code sepsis °

## 2021-11-28 NOTE — ED Notes (Signed)
Pt brief and pad changed pt had small BM in bed pan  ? ?

## 2021-11-28 NOTE — ED Notes (Signed)
Pt states she is hot and is refusing warming blanket or even a sheet over her  ? ?

## 2021-11-28 NOTE — Progress Notes (Signed)
CODE SEPSIS - PHARMACY COMMUNICATION ? ?**Broad Spectrum Antibiotics should be administered within 1 hour of Sepsis diagnosis** ? ?Time Code Sepsis Called/Page Received: 1:13pm ? ?Antibiotics Ordered: 12:48pm ? ?Time of 1st antibiotic administration: 2:05pm ? ?Additional action taken by pharmacy: N/A ? ?If necessary, Name of Provider/Nurse Contacted: N/A ? ? ?Kara Dies ,PharmD ?Clinical Pharmacist  ?11/28/2021  1:23 PM  ?

## 2021-11-28 NOTE — ED Notes (Signed)
Bair hugger applied to pt.  

## 2021-11-28 NOTE — ED Provider Notes (Signed)
[facility] Provider Note    Event Date/Time   First MD Initiated Contact with Patient 11/28/21 0945     (approximate)   History   Fall   HPI  Martha Vazquez is a 88 y.o. female with a past medical history of HTN, HDL, hard of hearing, gout previous UTIs as well as some CKD who presents accompanied by son for evaluation after a fall.  Patient resides at home and has home health several hours during the day.  She is a walker to get around.  Apparently she has had several falls last couple months.  Her most recent fall occurred last night.  Son states that he was with her within an hour to fall around 6 PM but she was still complaining of some left-sided shoulder pain today so wanted to get checked out.  Patient states she has left-sided shoulder pain and mid to lower back pain.  She denies any other acute pain including in the head, neck or extremities.  Per patient and family at bedside no other recent sick symptoms including nausea, vomiting, cough, diarrhea, rash or any other acute pain.  She is not on any blood thinners.  He states that she is at her neurological baseline is sometimes forgetful but does not have a history of dementia.    Past Medical History:  Diagnosis Date   Gout    HOH (hard of hearing)    Hyperlipidemia    Hypertension    UTI (urinary tract infection)      Physical Exam  Triage Vital Signs: ED Triage Vitals  Enc Vitals Group     BP 11/28/21 0934 (!) 141/67     Pulse Rate 11/28/21 0934 (!) 47     Resp 11/28/21 0934 17     Temp 11/28/21 0936 (!) 93.2 F (34 C)     Temp Source 11/28/21 0934 Oral     SpO2 11/28/21 0934 98 %     Weight 11/28/21 0936 155 lb (70.3 kg)     Height --      Head Circumference --      Peak Flow --      Pain Score 11/28/21 0935 3     Pain Loc --      Pain Edu? --      Excl. in [place]? --     Most recent vital signs: Vitals:   11/28/21 1130 11/28/21 1158  BP: (!) 155/57   Pulse: (!) 41   Resp: 11    Temp:  (!) 92.4 F (33.6 C)  SpO2: 97%     General: Awake, no distress.  CV:  Good peripheral perfusion.  2+ radial pulses.  Slightly bradycardic. Resp:  Normal effort.  Clear bilaterally Abd:  No distention.  Soft throughout. Other:  No tenderness of the C-spine but some mild tenderness over the lower T-spine and upper L-spine.  Cranials 2 through 12 are grossly intact.  No obvious trauma to the face scalp head or neck.  Patient is not oriented but this is baseline per family at bedside.  She is able to move all extremities symmetrically but does endorse some pain with passive range of motion of left shoulder and some mild tenderness throughout the left shoulder.  There is no effusion or deformity over the left shoulder.  Sensation is intact to light touch in all extremities.  No other obvious trauma to the extremities.     ED Results / Procedures / Treatments  Labs (all  labs ordered are listed, but only abnormal results are displayed) Labs Reviewed  COMPREHENSIVE METABOLIC PANEL - Abnormal; Notable for the following components:      Result Value   Potassium 5.8 (*)    CO2 21 (*)    BUN 31 (*)    Creatinine, Ser 1.05 (*)    GFR, Estimated 49 (*)    All other components within normal limits  CBC WITH DIFFERENTIAL/PLATELET - Abnormal; Notable for the following components:   WBC 10.6 (*)    All other components within normal limits  URINALYSIS, ROUTINE W REFLEX MICROSCOPIC - Abnormal; Notable for the following components:   Color, Urine YELLOW (*)    APPearance HAZY (*)    Leukocytes,Ua LARGE (*)    Bacteria, UA MANY (*)    All other components within normal limits  TSH - Abnormal; Notable for the following components:   TSH 4.681 (*)    All other components within normal limits  RESP PANEL BY RT-PCR (FLU A&B, COVID) ARPGX2  CULTURE, BLOOD (ROUTINE X 2)  CULTURE, BLOOD (ROUTINE X 2)  URINE CULTURE  LACTIC ACID, PLASMA  PROTIME-INR  CK  T4, FREE  TROPONIN I (HIGH  SENSITIVITY)     EKG  Sinus bradycardia with a ventricular rate of 48, first-degree block with a period of up to 86, left axis deviation without other clear evidence of acute ischemia or significant arrhythmia.   RADIOLOGY  CT head and C-spine, interpretation without evidence of skull fracture, intracranial hemorrhage, acute ischemia, edema, mass effect or acute C-spine injury.  I also reviewed radiology interpretation and agree with the findings of same.  Chest reviewed by myself shows no focal consoidation, effusion, edema, pneumothorax or other clear acute thoracic process. I also reviewed radiology interpretation and agree with findings described.  X-ray of the left shoulder on my interpretation  without evidence of acute fracture or dislocation.  I also reviewed radiology findings and agree with their interpretation in addition to notation of possible severe degenerative changes and some osteoarthritis.  PROCEDURES:  Critical Care performed: Yes, see critical care procedure note(s)  .Critical Care Performed by: Lucrezia Starch, MD Authorized by: Lucrezia Starch, MD   Critical care provider statement:    Critical care time (minutes):  30   Critical care was necessary to treat or prevent imminent or life-threatening deterioration of the following conditions:  Sepsis   Critical care was time spent personally by me on the following activities:  Development of treatment plan with patient or surrogate, discussions with consultants, evaluation of patient's response to treatment, examination of patient, ordering and review of laboratory studies, ordering and review of radiographic studies, ordering and performing treatments and interventions, pulse oximetry, re-evaluation of patient's condition and review of old charts    MEDICATIONS ORDERED IN ED: Medications  cefTRIAXone (ROCEPHIN) 1 g in sodium chloride 0.9 % 100 mL IVPB (has no administration in time range)  lactated ringers  bolus 1,000 mL (has no administration in time range)  lactated ringers bolus 1,000 mL (1,000 mLs Intravenous New Bag/Given 11/28/21 1104)     IMPRESSION / MDM / [place] / ED COURSE  I reviewed the triage vital signs and the nursing notes.                              Differential diagnosis includes, but is not limited to mechanical fall versus syncopal fall causing contusion of left shoulder versus  possible occult fracture.  Less patient for dislocation and she is otherwise neurovascular intact.  I have a lower suspicion for CVA.  Given it is not exactly clear if she hit her head or not and given her age will obtain a CT head and C-spine to assess for occult intracranial or C-spine injuries.  We will obtain EKG to look for evidence of arrhythmia and basic screening labs to assess for anemia, metabolic derangements and a UA to assess for possible cystitis as contributing factors.  CT head and C-spine, interpretation without evidence of skull fracture, intracranial hemorrhage, acute ischemia, edema, mass effect or acute C-spine injury.  I also reviewed radiology interpretation and agree with the findings of same.  Chest reviewed by myself shows no focal consoidation, effusion, edema, pneumothorax or other clear acute thoracic process. I also reviewed radiology interpretation and agree with findings described.  X-ray of the left shoulder on my interpretation  without evidence of acute fracture or dislocation.  I also reviewed radiology findings and agree with their interpretation in addition to notation of possible severe degenerative changes and some osteoarthritis.  CMP is remarkable for K of 5.8 without other significant electrolyte or metabolic derangements.  Lactic acid is not elevated 0.7.  CBC shows WBC count of 10.6 without evidence of acute anemia and normal platelets.  INR is unremarkable.  CK without evidence of myositis or rhabdomyolysis.  UA is concerning for cystitis with  large leukocyte esterase and 21-50 WBCs with many bacteria.  COVID influenza PCR is negative.  Troponin is negative and not suggestive of an acute cardiac event.  TSH slightly elevated at 4.68.  Free T4 sent.  Sinus bradycardia with a ventricular rate of 48, first-degree block with a period of up to 86, left axis deviation without other clear evidence of acute ischemia or significant arrhythmia.  Had extensive discussion with family at bedside regarding my concerns with patient's bradycardia could be related to sepsis and contribute to her falls.  It seems he has been evaluated for this in the past and declined any intervention including pacemaker.  Discussed that at this time given she does not have 24-hour care at home and do not feel she is safe to go home.  Family states that they want to keep her as comfortable as possible but are amenable to a trial of antibiotics over the next 24 hours and if there is no improvement focusing entirely on comfort.  They are clear that she is DNR/DNI and would not want any additional aggressive interventions such as central line.  I think this is reasonable.  We will treat with antibiotics and place patient on Bair hugger and give some fluids.  I discussed with hospitalist will place admission orders.     FINAL CLINICAL IMPRESSION(S) / ED DIAGNOSES   Final diagnoses:  Fall, initial encounter  Contusion of left shoulder, initial encounter  Bradycardia  Hyperkalemia  Acute cystitis without hematuria  Hypothermia, initial encounter  Goals of care, counseling/discussion     Rx / DC Orders   ED Discharge Orders     None        Note:  This document was prepared using Dragon voice recognition software and may include unintentional dictation errors.   Lucrezia Starch, MD 11/28/21 [phone]

## 2021-11-28 NOTE — ED Notes (Signed)
Patient transported to CT 

## 2021-11-28 NOTE — ED Notes (Signed)
Pt and family refusing blood work. Pt wants to be made comfort care.  ?

## 2021-11-28 NOTE — ED Notes (Signed)
Wannetta Sender, NP at bedside. Level of care will remain step down  ?

## 2021-11-29 ENCOUNTER — Other Ambulatory Visit: Payer: Self-pay

## 2021-11-29 DIAGNOSIS — E875 Hyperkalemia: Secondary | ICD-10-CM

## 2021-11-29 DIAGNOSIS — R001 Bradycardia, unspecified: Secondary | ICD-10-CM

## 2021-11-29 DIAGNOSIS — N390 Urinary tract infection, site not specified: Secondary | ICD-10-CM

## 2021-11-29 DIAGNOSIS — I10 Essential (primary) hypertension: Secondary | ICD-10-CM

## 2021-11-29 DIAGNOSIS — A419 Sepsis, unspecified organism: Principal | ICD-10-CM

## 2021-11-29 LAB — CBC
HCT: 42.3 % (ref 36.0–46.0)
Hemoglobin: 13.8 g/dL (ref 12.0–15.0)
MCH: 29.5 pg (ref 26.0–34.0)
MCHC: 32.6 g/dL (ref 30.0–36.0)
MCV: 90.4 fL (ref 80.0–100.0)
Platelets: 247 10*3/uL (ref 150–400)
RBC: 4.68 MIL/uL (ref 3.87–5.11)
RDW: 15.4 % (ref 11.5–15.5)
WBC: 15.6 10*3/uL — ABNORMAL HIGH (ref 4.0–10.5)
nRBC: 0 % (ref 0.0–0.2)

## 2021-11-29 LAB — BASIC METABOLIC PANEL
Anion gap: 5 (ref 5–15)
Anion gap: 6 (ref 5–15)
BUN: 26 mg/dL — ABNORMAL HIGH (ref 8–23)
BUN: 26 mg/dL — ABNORMAL HIGH (ref 8–23)
CO2: 22 mmol/L (ref 22–32)
CO2: 21 mmol/L — ABNORMAL LOW (ref 22–32)
Calcium: 9.5 mg/dL (ref 8.9–10.3)
Calcium: 9.3 mg/dL (ref 8.9–10.3)
Chloride: 110 mmol/L (ref 98–111)
Chloride: 108 mmol/L (ref 98–111)
Creatinine, Ser: 1.09 mg/dL — ABNORMAL HIGH (ref 0.44–1.00)
Creatinine, Ser: 1.25 mg/dL — ABNORMAL HIGH (ref 0.44–1.00)
GFR, Estimated: 47 mL/min — ABNORMAL LOW (ref >60)
GFR, Estimated: 40 mL/min — ABNORMAL LOW (ref >60)
Glucose, Bld: 100 mg/dL — ABNORMAL HIGH (ref 70–99)
Glucose, Bld: 121 mg/dL — ABNORMAL HIGH (ref 70–99)
Potassium: 6.1 mmol/L — ABNORMAL HIGH (ref 3.5–5.1)
Potassium: 5.3 mmol/L — ABNORMAL HIGH (ref 3.5–5.1)
Sodium: 137 mmol/L (ref 135–145)
Sodium: 135 mmol/L (ref 135–145)

## 2021-11-29 LAB — T4, FREE: Free T4: 1.13 ng/dL — ABNORMAL HIGH (ref 0.61–1.12)

## 2021-11-29 LAB — CBG MONITORING, ED
Glucose-Capillary: 94 mg/dL (ref 70–99)
Glucose-Capillary: 98 mg/dL (ref 70–99)

## 2021-11-29 MED ORDER — ONDANSETRON HCL 4 MG PO TABS
4.0000 mg | ORAL_TABLET | Freq: Four times a day (QID) | ORAL | 0 refills | Status: AC | PRN
Start: 2021-11-29 — End: ?

## 2021-11-29 MED ORDER — DEXTROSE 50 % IV SOLN
1.0000 | Freq: Once | INTRAVENOUS | Status: AC
  Administered 2021-11-29: 50 mL via INTRAVENOUS
  Filled 2021-11-29: qty 50

## 2021-11-29 MED ORDER — INSULIN ASPART 100 UNIT/ML IV SOLN
5.0000 [IU] | Freq: Once | INTRAVENOUS | Status: AC
  Administered 2021-11-29: 5 [IU] via INTRAVENOUS
  Filled 2021-11-29: qty 0.05

## 2021-11-29 NOTE — ED Notes (Signed)
Incontinent of stool and urine. Peri care performed. Linens changed. Brief and new primafit applied. No distress noted at this time. ?

## 2021-11-29 NOTE — Progress Notes (Signed)
Cross Cover ?Insulin, dextrose EKG ordered for potassium level 6.1. Per nurse report, lokelma dose ordered yesterday not given cause patient refused ? ?

## 2021-11-29 NOTE — Assessment & Plan Note (Signed)
Patient met criteria for sepsis given tachypnea, hypothermia and leukocytosis.  Initially given IV Rocephin.  Antibiotics stopped after patient decided to go to hospice.  She qualifies for in hospice facility given life expectancy of less than 2 weeks. ?

## 2021-11-29 NOTE — Progress Notes (Signed)
PT Cancellation Note ? ?Patient Details ?Name: Martha Vazquez ?MRN: [id number] ?DOB: Aug 27, 1924 ? ? ?Cancelled Treatment:    Reason Eval/Treat Not Completed: Medical issues which prohibited therapy (Consult received and chart reviewed. Patient noted with significant hyperkalemia (6.1), pending palliative consult.  Per notes, patient/family considering transition to comfort care.  Will hold PT evaluation at this time until patient more medically stable and goals of care clearly established.  Attending physician aware and in agreement.) ? ?Averlee Swartz H. Manson Passey, PT, DPT, NCS ?11/29/21, 9:48 AM ?[phone] ? ?

## 2021-11-29 NOTE — TOC Progression Note (Signed)
Transition of Care (TOC) - Progression Note  ? ? ?Patient Details  ?Name: Martha Vazquez ?MRN: [id number] ?Date of Birth: 02-Aug-1924 ? ?Transition of Care (TOC) CM/SW Contact  ?Marlowe Sax, RN ?Phone Number: ?11/29/2021, 2:31 PM ? ?Clinical Narrative:   The patient is scheduled to be transported to the hospice facility at 9 PM, ? ? ? ?Expected Discharge Plan: Hospice Medical Facility ?Barriers to Discharge: Continued Medical Work up ? ?Expected Discharge Plan and Services ?Expected Discharge Plan: Hospice Medical Facility ?  ?  ?Post Acute Care Choice: Hospice ?  ?                ?  ?  ?  ?  ?  ?  ?  ?  ?  ?  ? ? ?Social Determinants of Health (SDOH) Interventions ?  ? ?Readmission Risk Interventions ?No flowsheet data found. ? ?

## 2021-11-29 NOTE — ED Notes (Signed)
Patient alert, states, "I want to go home and die at home." SB on monitor at 58, resp even, unlabored on RA. No acute distress noted at this time.  ?

## 2021-11-29 NOTE — TOC Initial Note (Signed)
Transition of Care (TOC) - Initial/Assessment Note  ? ? ?Patient Details  ?Name: Martha Vazquez ?MRN: [id number] ?Date of Birth: 1923-12-18 ? ?Transition of Care (TOC) CM/SW Contact:    ?Allayne Butcher, RN ?Phone Number: ?11/29/2021, 11:07 AM ? ?Clinical Narrative:                 ?Patient admitted to the hospital for UTI.  Patient and her family have decided that they do not want to undergo any more treatment, no IV sticks, lab draws or procedures.  Patient was made comfort care and MD reports that he probably has less than 2 weeks to live.  Hospice facility referral given to [facility], she will evaluate patient for hospice home.   ? ?Expected Discharge Plan: Hospice Medical Facility ?Barriers to Discharge: Continued Medical Work up ? ? ?Patient Goals and CMS Choice ?Patient states their goals for this hospitalization and ongoing recovery are:: Family decided on comfort and hospice care. ?CMS Medicare.gov Compare Post Acute Care list provided to:: Patient Represenative (must comment) ?Choice offered to / list presented to : Adult Children ? ?Expected Discharge Plan and Services ?Expected Discharge Plan: Hospice Medical Facility ?  ?  ?Post Acute Care Choice: Hospice ?  ?                ?  ?  ?  ?  ?  ?  ?  ?  ?  ?  ? ?Prior Living Arrangements/Services ?  ?  ?  ?       ?  ?  ?  ?  ? ?Activities of Daily Living ?Home Assistive Devices/Equipment: Hearing aid ?ADL Screening (condition at time of admission) ?Patient's cognitive ability adequate to safely complete daily activities?: Yes ?Is the patient deaf or have difficulty hearing?: Yes ?Does the patient have difficulty seeing, even when wearing glasses/contacts?: No ?Does the patient have difficulty concentrating, remembering, or making decisions?: Yes ?Patient able to express need for assistance with ADLs?: Yes ?Does the patient have difficulty dressing or bathing?: Yes ?Independently performs ADLs?: No ?Communication: Independent with device  (comment) ?Dressing (OT): Dependent ?Is this a change from baseline?: Change from baseline, expected to last >3 days ?Grooming: Dependent ?Is this a change from baseline?: Change from baseline, expected to last >3 days ?Feeding: Needs assistance ?Is this a change from baseline?: Change from baseline, expected to last >3 days ?Bathing: Dependent ?Is this a change from baseline?: Change from baseline, expected to last >3 days ?Toileting: Dependent ?Is this a change from baseline?: Change from baseline, expected to last >3days ?In/Out Bed: Dependent ?Is this a change from baseline?: Change from baseline, expected to last >3 days ?Walks in Home: Dependent ?Is this a change from baseline?: Change from baseline, expected to last >3 days ?Does the patient have difficulty walking or climbing stairs?: Yes ?Weakness of Legs: Both ?Weakness of Arms/Hands: Both ? ?Permission Sought/Granted ?  ?  ?   ?   ?   ?   ? ?Emotional Assessment ?  ?  ?  ?  ?  ?  ? ?Admission diagnosis:  UTI (urinary tract infection) [N39.0] ?Sepsis (HCC) [A41.9] ?Patient Active Problem List  ? Diagnosis Date Noted  ? Sepsis secondary to UTI (HCC) 11/28/2021  ? UTI (urinary tract infection) 11/28/2021  ? Sepsis (HCC) 11/28/2021  ? Breast lump 04/04/2020  ? Gout 04/04/2020  ? Hypertension 04/04/2020  ? Shingles 04/04/2020  ? Bradycardia 07/29/2016  ? ?PCP:  Jerl Mina, MD ?Pharmacy:   ?  [facility] [address] [place] - [address] ?[address] ?[place] [address] ?Phone: [phone] Fax: [phone] ? ? ? ? ?Social Determinants of Health (SDOH) Interventions ?  ? ?Readmission Risk Interventions ?No flowsheet data found. ? ? ?

## 2021-11-29 NOTE — ED Notes (Signed)
Bair hugger removed per request. ? ?

## 2021-11-29 NOTE — Plan of Care (Signed)
PMT note: ? ?Epic chat with admitting MD, primary MD, TOC, and hospice liaison. Patient/family are clear regarding GOC. Hospice liaison will see them to work towards hospice facility placement. Will hold on consult at this time as goals are clear.  ? ?No charge note.  ?

## 2021-11-29 NOTE — Progress Notes (Signed)
Admission profile updated. ?

## 2021-11-29 NOTE — Progress Notes (Signed)
PT Cancellation Note ? ?Patient Details ?Name: Hilde Churchman ?MRN: [id number] ?DOB: 1924-07-06 ? ? ?Cancelled Treatment:    Reason Eval/Treat Not Completed:  (Upon further chart review, noted updated plan for transfer to hospice facility.  PT to complete order at this time; please re-consult should needs or goals of care change.) ? ?Norval Slaven H. Manson Passey, PT, DPT, NCS ?11/29/21, 2:08 PM ?[phone] ? ?

## 2021-11-29 NOTE — Progress Notes (Signed)
Nutrition Brief Note ? ?Chart reviewed. ?Palliative care consult pending. Per chart review, pt and family have verbalized desire for comfort care. RD will liberalize diet to widest variety of food selections, so pt can eat what she desires.  ?No further nutrition interventions planned at this time.  ?Please re-consult as needed.  ? ?Loistine Chance, RD, LDN, CDCES ?Registered Dietitian II ?Certified Diabetes Care and Education Specialist ?Please refer to [facility] for RD and/or RD on-call/weekend/after hours pager   ?

## 2021-11-29 NOTE — ED Notes (Signed)
Insulin requested from pharmacy at this time. 

## 2021-11-29 NOTE — ED Notes (Signed)
Patient reports she is cold. Attempted to get oral temp, thermometer read 94.10F. Rectal temperature not performed as patient has been requesting comfort measures, bair hugger reapplied at this time. No distress noted.  ?

## 2021-11-29 NOTE — ED Notes (Signed)
Cliffton Asters, NP messaged via secure chat to notify of Potassium of 6.1. ?

## 2021-11-29 NOTE — Hospital Course (Signed)
Patient is a 88 year old female with past medical history of recurrent UTIs, sinus bradycardia and hypertension who presented to the emergency room after a fall.  Patient was found to have a large urinary tract infection as well as hypothermia and tachypnea, and leukocytosis.  She was treated as found to be in sepsis.  Initially started on IV antibiotics but after discussion with patient and family, patient indicated that she would like to go to hospice.  She is continued to have frequent falls, poor quality of life and recurrent urinary tract infections.  Palliative care consulted and family met with hospice.  After extensive discussion, offer made for patient to go to hospice facility which after discussion with patient, family and patient excepted. ? ?

## 2021-11-29 NOTE — Discharge Summary (Addendum)
Physician Discharge Summary   Patient: Martha Vazquez MRN: [id number] DOB: 02/10/1924  Admit date:     11/28/2021  Discharge date: 11/29/21  Discharge Physician: Annita Brod   PCP: Maryland Pink, MD   Recommendations at discharge:   Medication change: Home medications discontinued. Patient being discharged to hospice facility.  Discharge Diagnoses: Principal Problem:   Sepsis secondary to UTI [facility]) Active Problems:   Bradycardia   Hypertension  Resolved Problems:   * No resolved hospital problems. [facility] Course: Patient is a 88 year old female with past medical history of recurrent UTIs, sinus bradycardia and hypertension who presented to the emergency room after a fall.  Patient was found to have a large urinary tract infection as well as hypothermia and tachypnea, and leukocytosis.  She was treated as found to be in sepsis.  Initially started on IV antibiotics but after discussion with patient and family, patient indicated that she would like to go to hospice.  She is continued to have frequent falls, poor quality of life and recurrent urinary tract infections.  Palliative care consulted and family met with hospice.  After extensive discussion, offer made for patient to go to hospice facility which after discussion with patient, family and patient excepted.   Assessment and Plan: * Sepsis secondary to UTI [facility]) Patient met criteria for sepsis given tachypnea, hypothermia and leukocytosis.  Initially given IV Rocephin.  Antibiotics stopped after patient decided to go to hospice.  She qualifies for in hospice facility given life expectancy of less than 2 weeks.  Hypertension Blood pressure slightly elevated during hospitalization.  All home medications discontinued for hospice  Bradycardia Status post pacemaker.  Heart rate ranged from 40s to 60s.  Stage IIIa chronic kidney disease: Close to baseline during most of hospitalization.  Patient going home with hospice for  further work-up deferred       Consultants: Palliative care Procedures performed: None Disposition: Hospice care Diet recommendation:  Discharge Diet Orders (From admission, onward)     Start     Ordered   11/29/21 0000  Diet - low sodium heart healthy        11/29/21 1500           Regular diet DISCHARGE MEDICATION: Allergies as of 11/29/2021       Reactions   Prednisone Swelling   Sulfamethoxazole-trimethoprim Swelling        Medication List     STOP taking these medications    allopurinol 100 MG tablet Commonly known as: ZYLOPRIM   amLODipine 10 MG tablet Commonly known as: NORVASC   lisinopril 20 MG tablet Commonly known as: ZESTRIL   meloxicam 7.5 MG tablet Commonly known as: MOBIC   pravastatin 40 MG tablet Commonly known as: PRAVACHOL       TAKE these medications    ondansetron 4 MG tablet Commonly known as: ZOFRAN Take 1 tablet (4 mg total) by mouth every 6 (six) hours as needed for nausea.        Discharge Exam: Filed Weights   11/28/21 0936  Weight: 70.3 kg   General: Alert and oriented x2, no acute distress, hard of hearing Cardiovascular: Regular rate and rhythm, S1-S2 Lungs: Clear to auscultation bilaterally  Condition at discharge: poor  The results of significant diagnostics from this hospitalization (including imaging, microbiology, ancillary and laboratory) are listed below for reference.   Imaging Studies: DG Chest 2 View  Result Date: 11/28/2021 CLINICAL DATA:  Suspected sepsis.  Fall. EXAM: CHEST - 2 VIEW COMPARISON:  Chest radiographs 07/29/2006 FINDINGS: The cardiac silhouette is borderline enlarged. Aortic atherosclerosis is noted. Lung volumes are low with mild prominence of the interstitial markings. No overt edema, airspace consolidation, pleural effusion, or pneumothorax is identified. Asymmetrically advanced degenerative changes are noted at the left shoulder. The thoracic spine is reported separately.  IMPRESSION: No active cardiopulmonary disease. Electronically Signed   By: Logan Bores M.D.   On: 11/28/2021 10:43   DG Thoracic Spine 2 View  Result Date: 11/28/2021 CLINICAL DATA:  Trauma, fall EXAM: THORACIC SPINE 2 VIEWS COMPARISON:  None. FINDINGS: No recent fracture is seen. Alignment of posterior margins of vertebral bodies is unremarkable. There is exaggeration of kyphosis. Osteopenia is seen in bony structures. IMPRESSION: No recent fracture is seen in the thoracic spine. Electronically Signed   By: Elmer Picker M.D.   On: 11/28/2021 10:48   DG Lumbar Spine Complete  Result Date: 11/28/2021 CLINICAL DATA:  Trauma, fall EXAM: LUMBAR SPINE - COMPLETE 4+ VIEW COMPARISON:  None. FINDINGS: No recent fracture is seen. Degenerative changes are noted with disc space narrowing, bony spurs and facet hypertrophy, more severe at L4-L5 and L5-S1 levels. There is mild anterolisthesis at the L3-L4 level. Extensive arterial calcifications are seen in the aorta and its major branches. Coarse calcification in the pelvis may suggest calcified uterine fibroid. IMPRESSION: Recent fracture is seen. Severe lumbar spondylosis. There is mild anterolisthesis at L3-L4 level. Electronically Signed   By: Elmer Picker M.D.   On: 11/28/2021 10:47   CT HEAD WO CONTRAST (5MM)  Result Date: 11/28/2021 CLINICAL DATA:  [place] yesterday. EXAM: CT HEAD WITHOUT CONTRAST CT CERVICAL SPINE WITHOUT CONTRAST TECHNIQUE: Multidetector CT imaging of the head and cervical spine was performed following the standard protocol without intravenous contrast. Multiplanar CT image reconstructions of the cervical spine were also generated. RADIATION DOSE REDUCTION: This exam was performed according to the departmental dose-optimization program which includes automated exposure control, adjustment of the mA and/or kV according to patient size and/or use of iterative reconstruction technique. COMPARISON:  None. FINDINGS: CT HEAD FINDINGS  Brain: No evidence of acute infarction, hemorrhage, hydrocephalus, extra-axial collection or mass lesion/mass effect. Mild generalized cerebral atrophy. Vascular: Calcified atherosclerosis at the skull base. No hyperdense vessel. Skull: Normal. Negative for fracture or focal lesion. Sinuses/Orbits: No acute finding. Other: None. CT CERVICAL SPINE FINDINGS Alignment: No traumatic malalignment. Skull base and vertebrae: No acute fracture. No primary bone lesion or focal pathologic process. Soft tissues and spinal canal: No prevertebral fluid or swelling. No visible canal hematoma. Disc levels: Mild disc height loss at C3-C4. Moderate disc height loss at C4-C5 and C5-C6. Mild diffuse facet arthropathy throughout the cervical spine. Ankylosis of the C2-C3 facet joints. Upper chest: Negative. Other: None. IMPRESSION: 1. No acute intracranial abnormality. 2. No acute fracture or traumatic malalignment of the cervical spine. Electronically Signed   By: Titus Dubin M.D.   On: 11/28/2021 10:27   CT Cervical Spine Wo Contrast  Result Date: 11/28/2021 CLINICAL DATA:  [place] yesterday. EXAM: CT HEAD WITHOUT CONTRAST CT CERVICAL SPINE WITHOUT CONTRAST TECHNIQUE: Multidetector CT imaging of the head and cervical spine was performed following the standard protocol without intravenous contrast. Multiplanar CT image reconstructions of the cervical spine were also generated. RADIATION DOSE REDUCTION: This exam was performed according to the departmental dose-optimization program which includes automated exposure control, adjustment of the mA and/or kV according to patient size and/or use of iterative reconstruction technique. COMPARISON:  None. FINDINGS: CT HEAD FINDINGS Brain: No evidence of  acute infarction, hemorrhage, hydrocephalus, extra-axial collection or mass lesion/mass effect. Mild generalized cerebral atrophy. Vascular: Calcified atherosclerosis at the skull base. No hyperdense vessel. Skull: Normal. Negative for  fracture or focal lesion. Sinuses/Orbits: No acute finding. Other: None. CT CERVICAL SPINE FINDINGS Alignment: No traumatic malalignment. Skull base and vertebrae: No acute fracture. No primary bone lesion or focal pathologic process. Soft tissues and spinal canal: No prevertebral fluid or swelling. No visible canal hematoma. Disc levels: Mild disc height loss at C3-C4. Moderate disc height loss at C4-C5 and C5-C6. Mild diffuse facet arthropathy throughout the cervical spine. Ankylosis of the C2-C3 facet joints. Upper chest: Negative. Other: None. IMPRESSION: 1. No acute intracranial abnormality. 2. No acute fracture or traumatic malalignment of the cervical spine. Electronically Signed   By: Titus Dubin M.D.   On: 11/28/2021 10:27   DG Shoulder Left  Result Date: 11/28/2021 CLINICAL DATA:  Trauma, fall EXAM: LEFT SHOULDER - 2+ VIEW COMPARISON:  None. FINDINGS: No recent fracture or dislocation is seen. Marked degenerative changes are noted with large bony spurs. There is flattening of humeral head along with sclerosis. This may be related to degenerative arthritis or avascular necrosis. There are small smooth marginated calcific densities adjacent to the greater tuberosity, possibly residual from previous injury or related to osteoarthritis. IMPRESSION: No recent fracture or dislocation is seen. Severe degenerative changes are noted in the left shoulder. There is flattening and sclerosis in the humeral head which may be related to osteoarthritis. Less likely possibility would be avascular necrosis. Electronically Signed   By: Elmer Picker M.D.   On: 11/28/2021 10:45    Microbiology: Results for orders placed or performed during the hospital encounter of 11/28/21  Culture, blood (Routine x 2)     Status: None (Preliminary result)   Collection Time: 11/28/21  9:56 AM   Specimen: BLOOD  Result Value Ref Range Status   Specimen Description BLOOD RIGHT [facility]  Final   Special Requests   Final     BOTTLES DRAWN AEROBIC AND ANAEROBIC Blood Culture results may not be optimal due to an inadequate volume of blood received in culture bottles   Culture   Final    NO GROWTH < 24 HOURS Performed at [facility], [address] [place], [place] [address]    Report Status PENDING  Incomplete  Culture, blood (Routine x 2)     Status: None (Preliminary result)   Collection Time: 11/28/21  9:56 AM   Specimen: BLOOD  Result Value Ref Range Status   Specimen Description BLOOD RIGHT FA  Final   Special Requests   Final    BOTTLES DRAWN AEROBIC AND ANAEROBIC Blood Culture adequate volume   Culture   Final    NO GROWTH < 24 HOURS Performed at [facility], [address], [place], [place] [address]    Report Status PENDING  Incomplete  Resp Panel by RT-PCR (Flu A&B, Covid) Nasopharyngeal Swab     Status: None   Collection Time: 11/28/21  9:56 AM   Specimen: Nasopharyngeal Swab; Nasopharyngeal(NP) swabs in vial transport medium  Result Value Ref Range Status   SARS Coronavirus 2 by RT PCR NEGATIVE NEGATIVE Final    Comment: (NOTE) SARS-CoV-2 target nucleic acids are NOT DETECTED.  The SARS-CoV-2 RNA is generally detectable in upper respiratory specimens during the acute phase of infection. The lowest concentration of SARS-CoV-2 viral copies this assay can detect is 138 copies/mL. A negative result does not preclude SARS-Cov-2 infection and should not  be used as the sole basis for treatment or other patient management decisions. A negative result may occur with  improper specimen collection/handling, submission of specimen other than nasopharyngeal swab, presence of viral mutation(s) within the areas targeted by this assay, and inadequate number of viral copies(<138 copies/mL). A negative result must be combined with clinical observations, patient history, and epidemiological information. The expected result is Negative.  Fact Sheet for Patients:   EntrepreneurPulse.com.au  Fact Sheet for Healthcare Providers:  IncredibleEmployment.be  This test is no t yet approved or cleared by the [place] FDA and  has been authorized for detection and/or diagnosis of SARS-CoV-2 by FDA under an Emergency Use Authorization (EUA). This EUA will remain  in effect (meaning this test can be used) for the duration of the COVID-19 declaration under Section 564(b)(1) of the Act, 21 U.S.C.section 360bbb-3(b)(1), unless the authorization is terminated  or revoked sooner.       Influenza A by PCR NEGATIVE NEGATIVE Final   Influenza B by PCR NEGATIVE NEGATIVE Final    Comment: (NOTE) The Xpert Xpress SARS-CoV-2/FLU/RSV plus assay is intended as an aid in the diagnosis of influenza from Nasopharyngeal swab specimens and should not be used as a sole basis for treatment. Nasal washings and aspirates are unacceptable for Xpert Xpress SARS-CoV-2/FLU/RSV testing.  Fact Sheet for Patients: EntrepreneurPulse.com.au  Fact Sheet for Healthcare Providers: IncredibleEmployment.be  This test is not yet approved or cleared by the [place] FDA and has been authorized for detection and/or diagnosis of SARS-CoV-2 by FDA under an Emergency Use Authorization (EUA). This EUA will remain in effect (meaning this test can be used) for the duration of the COVID-19 declaration under Section 564(b)(1) of the Act, 21 U.S.C. section 360bbb-3(b)(1), unless the authorization is terminated or revoked.  Performed at [facility], [address], [place], [place] [address]   Urine Culture     Status: Abnormal (Preliminary result)   Collection Time: 11/28/21  9:56 AM   Specimen: Urine, Clean Catch  Result Value Ref Range Status   Specimen Description   Final    URINE, CLEAN CATCH Performed at [facility], [address] [place], [place] [address]    Special Requests    Final    NONE Performed at [facility], [address], [place], [place] [address]    Culture (A)  Final    >=100,000 COLONIES/mL ESCHERICHIA COLI SUSCEPTIBILITIES TO FOLLOW Performed at [facility], [place] [address], [place], [place] [address]    Report Status PENDING  Incomplete    Labs: CBC: Recent Labs  Lab 11/28/21 0956 11/29/21 0521  WBC 10.6* 15.6*  NEUTROABS 7.1  --   HGB 14.4 13.8  HCT 45.0 42.3  MCV 91.3 90.4  PLT 276 381   Basic Metabolic Panel: Recent Labs  Lab 11/28/21 0956 11/29/21 0521 11/29/21 1059  NA 139 137 135  K 5.8* 6.1* 5.3*  CL 110 110 108  CO2 21* 22 21*  GLUCOSE 92 100* 121*  BUN 31* 26* 26*  CREATININE 1.05* 1.09* 1.25*  CALCIUM 9.9 9.5 9.3   Liver Function Tests: Recent Labs  Lab 11/28/21 0956  AST 28  ALT 25  ALKPHOS 99  BILITOT 0.5  PROT 7.4  ALBUMIN 3.9   CBG: Recent Labs  Lab 11/29/21 0629 11/29/21 0722  GLUCAP 94 98    Discharge time spent: less than 30 minutes.  Signed: Annita Brod, MD [facility] 11/29/2021

## 2021-11-29 NOTE — Assessment & Plan Note (Signed)
Blood pressure slightly elevated during hospitalization.  All home medications discontinued for hospice ?

## 2021-11-29 NOTE — Assessment & Plan Note (Signed)
Status post pacemaker.  Heart rate ranged from 40s to 60s. ?

## 2021-11-29 NOTE — ED Notes (Signed)
Lt green recollected and sent to lab.  

## 2021-11-29 NOTE — ED Notes (Signed)
Informed RN bed assigned 

## 2021-11-29 NOTE — Progress Notes (Addendum)
Civil engineer, contracting [facility]) Hospital Liaison Note ? ?Received request from Transitions of Care Manager Charisse March for family interest in Hospice Home. Visited patient at bedside and spoke with [employer]on [employer]/[place] [phone]) to confirm interest and explain services. ? ?Approval for Hospice Home is determined by [facility] MD. Once [facility] MD has determined Hospice Home eligibility, [facility] will update hospital staff and family. ? ?Addendum: 2:45p ?Consent forms to be completed. ? ?EMS notified of patient D/C and transport arranged for 9p by MSW. TOC/Deliliah and Attending Physician/Dr. Rito Ehrlich also notified of transport arrangement.  ?  ?Please send signed DNR form with patient and RN call report to [phone]. ? ?Please do not hesitate to call with any hospice related questions.  ?  ?Thank you for the opportunity to participate in this patient's care. ? ?Odette Fraction, MSW ?[facility] Hospital Liaison  ?[phone] ? ?

## 2021-11-29 NOTE — Progress Notes (Signed)
OT Cancellation Note ? ?Patient Details ?Name: Martha Vazquez ?MRN: [id number] ?DOB: 06-13-1924 ? ? ?Cancelled Treatment:    Reason Eval/Treat Not Completed: Other (comment) ?Upon chart review, it appears GOC have been discussed with patient and family. Upon reviewing palliative NP documentation, noted that plan is for patient to transition to hospice facility. Will complete OT order at this time. Thank you. ? ?Gerrianne Scale, MS, OTR/L ?ascom [phone] ?11/29/21, 10:45 AM  ?

## 2021-11-30 LAB — URINE CULTURE: Culture: >=100000 — AB

## 2021-12-03 LAB — CULTURE, BLOOD (ROUTINE X 2)
Culture: NO GROWTH
Culture: NO GROWTH
Special Requests: ADEQUATE

## 2022-04-22 DEATH — deceased

## 2022-10-18 IMAGING — CT CT CERVICAL SPINE W/O CM
3 series · 15 of 33 positions shown, 18 images · non-contrast
Comparison: None.

CLINICAL DATA: Fell yesterday.



[Series 4: c spine soft · axial · 0.31mm/px · z∈[-227,-79]mm · 7 of 88 slices shown, 9 images]
[im 7/88  soft-tissue]
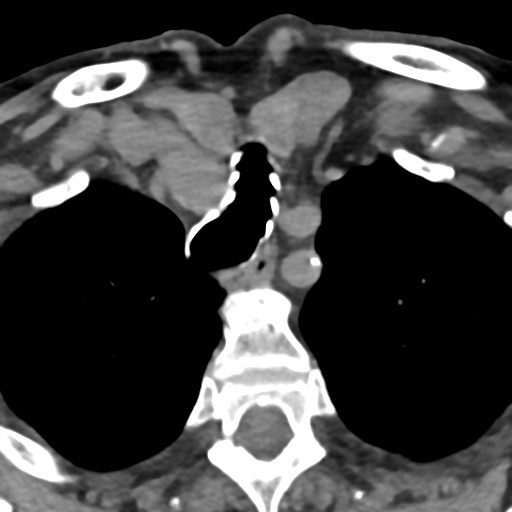
[im 7/88  bone]
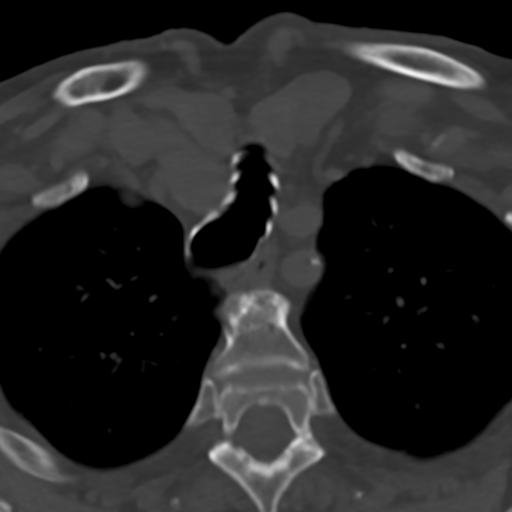
[im 21/88  bone]
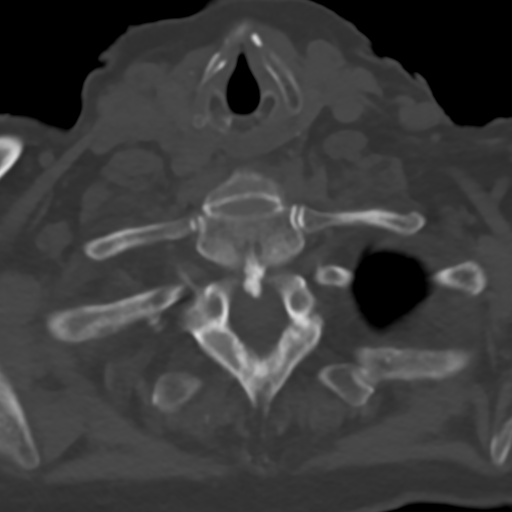
[im 34/88  bone]
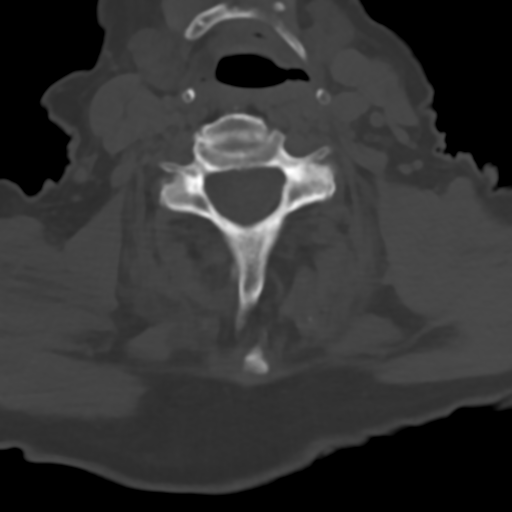
[im 47/88  bone]
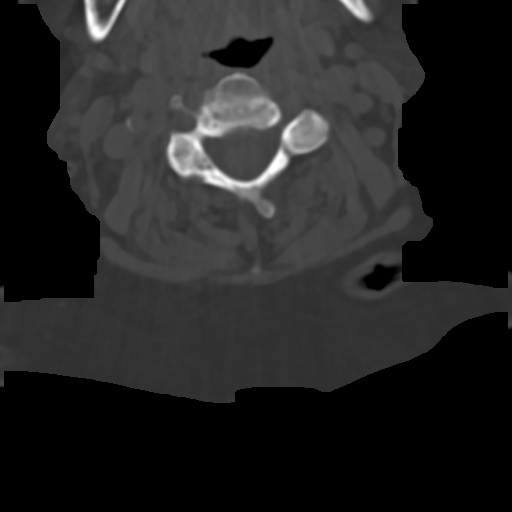
[im 54/88  soft-tissue]
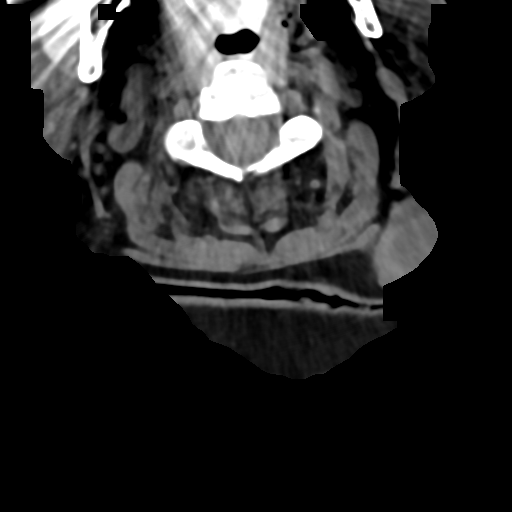
[im 54/88  bone]
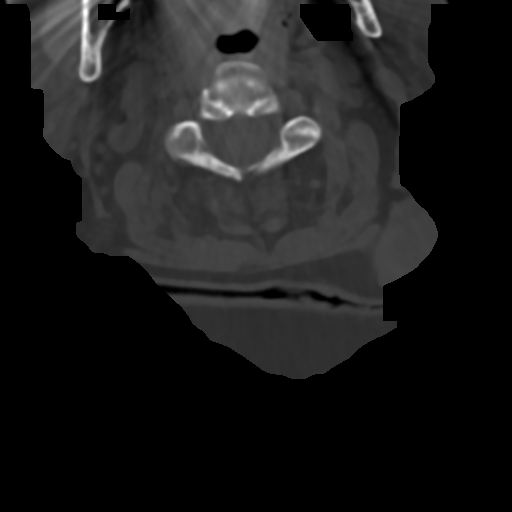
[im 67/88  bone]
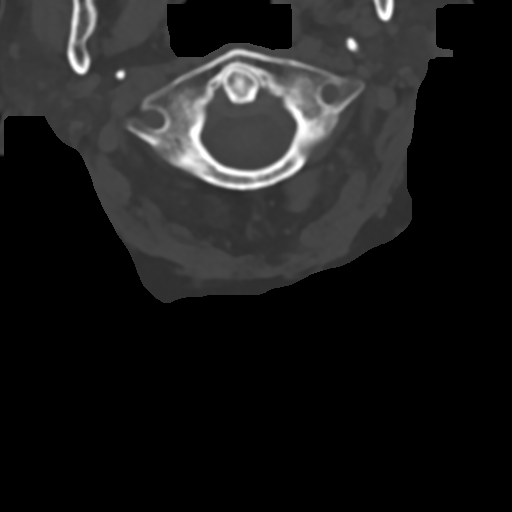
[im 81/88  bone]
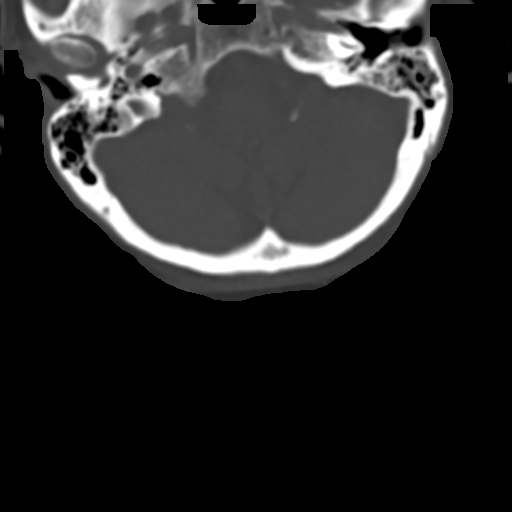

[Series 7: sag bone · sagittal · 0.35mm/px · 5 of 69 slices shown, 6 images]
[im 23/69  bone]
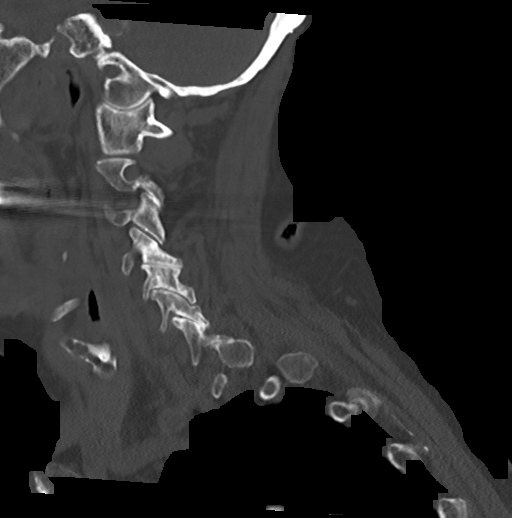
[im 29/69  bone]
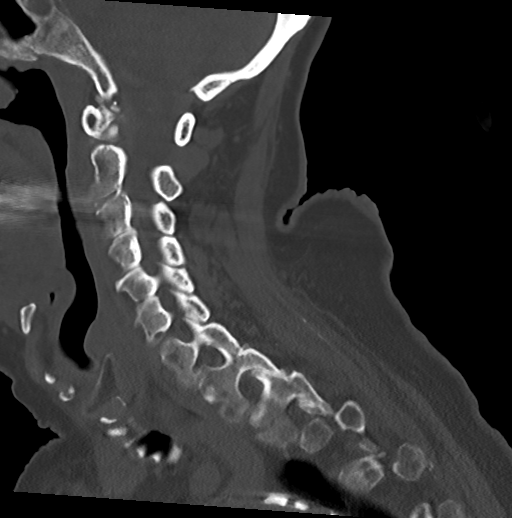
[im 35/69  soft-tissue]
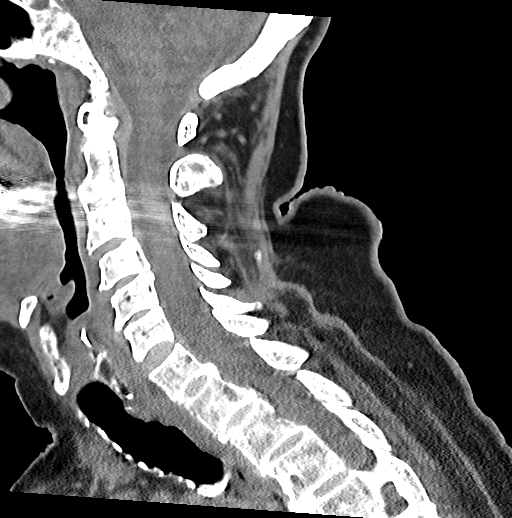
[im 35/69  bone]
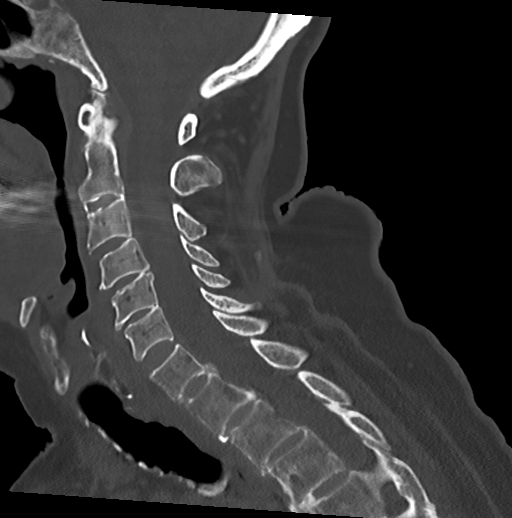
[im 40/69  bone]
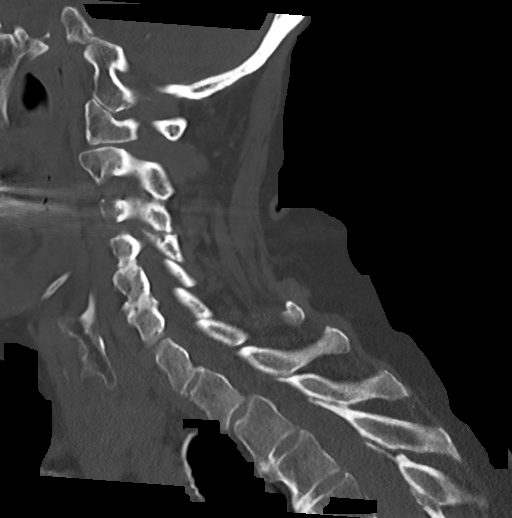
[im 46/69  bone]
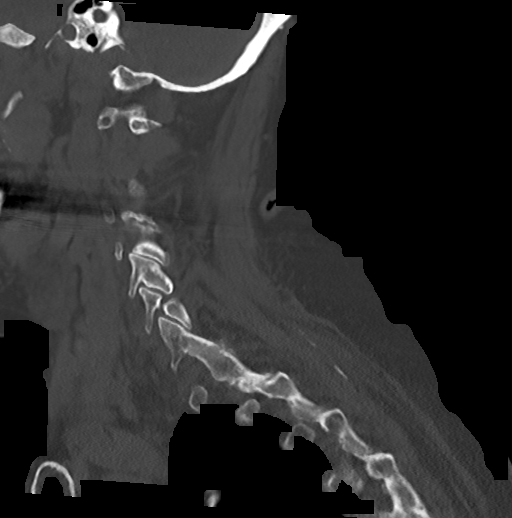

[Series 8: cor bone · coronal · 0.27mm/px · 3 of 91 slices shown]
[im 19/91  bone]
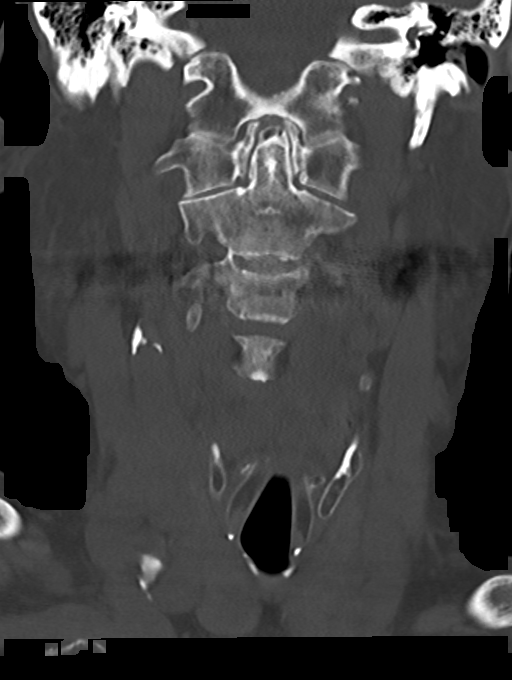
[im 37/91  bone]
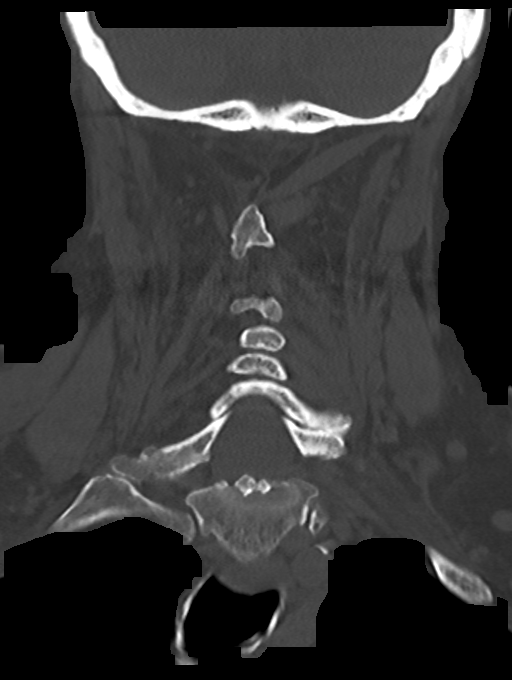
[im 55/91  bone]
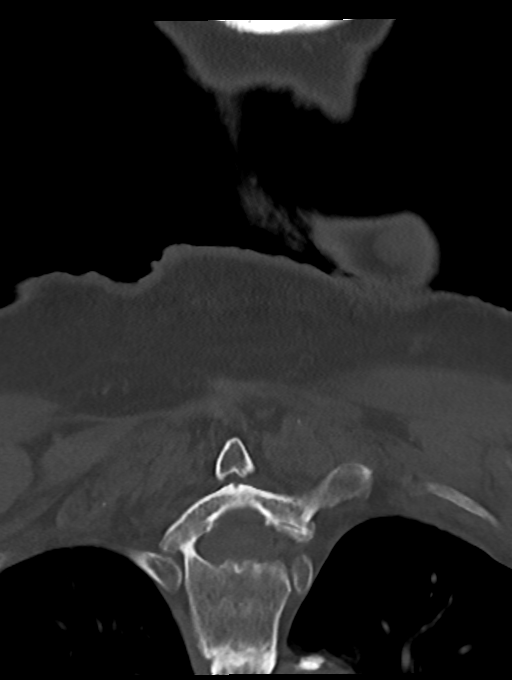

[15 of 33 positions shown; findings below may reference images not displayed]

FINDINGS: CT HEAD FINDINGS

Brain: No evidence of acute infarction, hemorrhage, hydrocephalus,
extra-axial collection or mass lesion/mass effect. Mild generalized
cerebral atrophy.

Vascular: Calcified atherosclerosis at the skull base. No hyperdense
vessel.

Skull: Normal. Negative for fracture or focal lesion.

Sinuses/Orbits: No acute finding.

Other: None.

CT CERVICAL SPINE FINDINGS

Alignment: No traumatic malalignment.

Skull base and vertebrae: No acute fracture. No primary bone lesion
or focal pathologic process.

Soft tissues and spinal canal: No prevertebral fluid or swelling. No
visible canal hematoma.

Disc levels: Mild disc height loss at C3-C4. Moderate disc height
loss at C4-C5 and C5-C6. Mild diffuse facet arthropathy throughout
the cervical spine. Ankylosis of the C2-C3 facet joints.

Upper chest: Negative.

Other: None.
IMPRESSION: 1. No acute intracranial abnormality.
2. No acute fracture or traumatic malalignment of the cervical
spine.
